# Patient Record
Sex: Female | Born: 1985 | Race: White | Hispanic: No | Smoking: Former smoker
Health system: Southern US, Community
[De-identification: ages and names within clinical notes are randomized; demographics above are authoritative.]

## PROBLEM LIST (undated history)

## (undated) DIAGNOSIS — F909 Attention-deficit hyperactivity disorder, unspecified type: Secondary | ICD-10-CM

## (undated) DIAGNOSIS — F329 Major depressive disorder, single episode, unspecified: Secondary | ICD-10-CM

## (undated) DIAGNOSIS — F32A Depression, unspecified: Secondary | ICD-10-CM

## (undated) DIAGNOSIS — F419 Anxiety disorder, unspecified: Secondary | ICD-10-CM

## (undated) DIAGNOSIS — B977 Papillomavirus as the cause of diseases classified elsewhere: Secondary | ICD-10-CM

## (undated) HISTORY — PX: NO PAST SURGERIES: SHX2092

## (undated) HISTORY — DX: Papillomavirus as the cause of diseases classified elsewhere: B97.7

---

## 2004-10-06 DIAGNOSIS — B977 Papillomavirus as the cause of diseases classified elsewhere: Secondary | ICD-10-CM

## 2004-10-06 HISTORY — DX: Papillomavirus as the cause of diseases classified elsewhere: B97.7

## 2014-03-07 ENCOUNTER — Encounter (HOSPITAL_COMMUNITY): Payer: Self-pay | Admitting: Emergency Medicine

## 2014-03-07 ENCOUNTER — Emergency Department (HOSPITAL_COMMUNITY)
Admission: EM | Admit: 2014-03-07 | Discharge: 2014-03-07 | Disposition: A | Discharge status: Home / Self Care | Payer: BC Managed Care – PPO | Attending: Emergency Medicine | Admitting: Emergency Medicine

## 2014-03-07 ENCOUNTER — Emergency Department (HOSPITAL_COMMUNITY): Payer: BC Managed Care – PPO

## 2014-03-07 DIAGNOSIS — Z3202 Encounter for pregnancy test, result negative: Secondary | ICD-10-CM | POA: Insufficient documentation

## 2014-03-07 DIAGNOSIS — Y92009 Unspecified place in unspecified non-institutional (private) residence as the place of occurrence of the external cause: Secondary | ICD-10-CM | POA: Insufficient documentation

## 2014-03-07 DIAGNOSIS — S62339A Displaced fracture of neck of unspecified metacarpal bone, initial encounter for closed fracture: Secondary | ICD-10-CM

## 2014-03-07 DIAGNOSIS — F17200 Nicotine dependence, unspecified, uncomplicated: Secondary | ICD-10-CM | POA: Insufficient documentation

## 2014-03-07 DIAGNOSIS — W2209XA Striking against other stationary object, initial encounter: Secondary | ICD-10-CM | POA: Insufficient documentation

## 2014-03-07 DIAGNOSIS — N390 Urinary tract infection, site not specified: Secondary | ICD-10-CM

## 2014-03-07 DIAGNOSIS — S62319A Displaced fracture of base of unspecified metacarpal bone, initial encounter for closed fracture: Secondary | ICD-10-CM | POA: Insufficient documentation

## 2014-03-07 DIAGNOSIS — Y9389 Activity, other specified: Secondary | ICD-10-CM | POA: Insufficient documentation

## 2014-03-07 LAB — POC URINE PREG, ED: Preg Test, Ur: NEGATIVE

## 2014-03-07 LAB — URINALYSIS, ROUTINE W REFLEX MICROSCOPIC
Bilirubin Urine: NEGATIVE
Glucose, UA: NEGATIVE mg/dL
Ketones, ur: NEGATIVE mg/dL
Nitrite: POSITIVE — AB
PH: 7.5 (ref 5.0–8.0)
Protein, ur: NEGATIVE mg/dL
Specific Gravity, Urine: 1.016 (ref 1.005–1.030)
UROBILINOGEN UA: 0.2 mg/dL (ref 0.0–1.0)

## 2014-03-07 LAB — URINE MICROSCOPIC-ADD ON

## 2014-03-07 MED ORDER — NITROFURANTOIN MONOHYD MACRO 100 MG PO CAPS: 100.0000 mg | ORAL_CAPSULE | Freq: Two times a day (BID) | ORAL | Status: DC

## 2014-03-07 MED ORDER — HYDROCODONE-ACETAMINOPHEN 5-325 MG PO TABS: ORAL_TABLET | ORAL | Status: DC

## 2014-03-07 MED ORDER — NAPROXEN 500 MG PO TABS: 500.0000 mg | ORAL_TABLET | Freq: Two times a day (BID) | ORAL | Status: DC

## 2014-03-07 NOTE — ED Notes (Signed)
Ortho at [facility]

## 2014-03-07 NOTE — ED Provider Notes (Signed)
CSN: [id number]     Arrival date & time 03/07/14  2057 History  This chart was scribed for non-physician practitioner, Renne Crigler, PA-C, working with Gilda Crease, MD by Shari Heritage, ED Scribe. This patient was seen in room TR08C/TR08C and the patient's care was started at 10:05 PM.   Chief Complaint  Patient presents with  . Hand Injury    The history is provided by the patient. No language interpreter was used.   HPI Comments: Gloria Heath is a 28 y.o. female who presents to the Emergency Department complaining of constant, moderate right hand pain resulting from an injury that occurred 3 hours ago. Patient states that she punched the a [place] cabinet at home and began having pain immediately after. Pain is worse with range of motion of her fingers. She has applied ice the her hand, but has not taken any medications for pain relief. She reports associated tingling to her right hand. There is no weakness in the right upper extremity.   Patient also reports that she was diagnosed with a UTI at [facility] about 3 weeks ago and was treated with an antibiotic (likely cipro, patient cannot remember exactly). She states that in the last few days, urinary symptoms have returned. She says that she develops recurrent UTIs and has a consult scheduled with [facility]. There is no fever, chills, nausea, or vomiting.   History reviewed. No pertinent past medical history. History reviewed. No pertinent past surgical history. History reviewed. No pertinent family history. History  Substance Use Topics  . Smoking status: Light Tobacco Smoker  . Smokeless tobacco: Not on file  . Alcohol Use: Yes   OB History   Grav Para Term Preterm Abortions TAB SAB Ect Mult Living                 Review of Systems  Constitutional: Negative for fever and chills.  Gastrointestinal: Negative for nausea and vomiting.  Genitourinary: Positive for frequency.  Musculoskeletal: Positive for  arthralgias (right hand pain). Negative for joint swelling.  Neurological: Negative for weakness.    Allergies  Review of patient's allergies indicates no known allergies.  Home Medications   Prior to Admission medications   Not on File   Physical Exam  Nursing note and vitals reviewed. Constitutional: She appears well-developed and well-nourished. No distress.  HENT:  Head: Normocephalic and atraumatic.  Eyes: Conjunctivae and EOM are normal.  Neck: Normal range of motion. No tracheal deviation present.  Cardiovascular: Normal rate.   Pulses:      Radial pulses are 2+ on the right side, and 2+ on the left side.  Pulmonary/Chest: Effort normal. No respiratory distress.  Abdominal: She exhibits no distension. There is no tenderness. There is no rebound.  Musculoskeletal:       Right shoulder: Normal.       Right elbow: Normal.      Right wrist: She exhibits normal range of motion, no tenderness, no bony tenderness and no swelling.       Right hand: She exhibits decreased range of motion, tenderness, bony tenderness, deformity and swelling. She exhibits normal capillary refill and no laceration. Normal sensation noted. Normal strength noted.       Hands: Neurological: She is alert.  Skin: Skin is warm and dry.  Psychiatric: She has a normal mood and affect. Her behavior is normal.    ED Course  Procedures (including critical care time) DIAGNOSTIC STUDIES: Oxygen Saturation is 100% on room air, normal by  my interpretation.    COORDINATION OF CARE: 10:12 PM- Patient informed of current plan for treatment and evaluation and agrees with plan at this time.  BP 129/84  Pulse 73  Temp(Src) 99.3 F (37.4 C) (Oral)  Resp 18  Wt 182 lb 6 oz (82.725 kg)  SpO2 100%  LMP 02/23/2014   Labs Reviewed  URINALYSIS, ROUTINE W REFLEX MICROSCOPIC - Abnormal; Notable for the following:    APPearance CLOUDY (*)    Hgb urine dipstick SMALL (*)    Nitrite POSITIVE (*)    Leukocytes, UA  MODERATE (*)    All other components within normal limits  URINE MICROSCOPIC-ADD ON - Abnormal; Notable for the following:    Squamous Epithelial / LPF FEW (*)    Bacteria, UA MANY (*)    All other components within normal limits  POC URINE PREG, ED     Imaging Review Dg Hand Complete Right  03/07/2014   CLINICAL DATA:  Injury.  Fifth metacarpal region pain.  EXAM: RIGHT HAND - COMPLETE 3+ VIEW  COMPARISON:  None.  FINDINGS: There is a transverse fracture across the distal fifth metacarpal. Is nondisplaced. It is angulated anteriorly by approximately 25 degrees. No significant comminution.  No other fractures. No dislocation. There is dorsal ulnar soft tissue swelling.  IMPRESSION: Nondisplaced, anteriorly angulated, fracture of the distal right fifth metacarpal.   Electronically Signed   By: Amie Portlandavid  Ormond M.D.   On: 03/07/2014 21:37     EKG Interpretation None      Patient seen and examined. Swelling of right hand with minimal deformity. No obvious rotation of the finger.  Ulnar gutter splint by orthopedic tech. Will discharge home with pain medicine and hand followup.  Patient also with urinary tract infection, will treat with Macrobid.   MDM   Final diagnoses:  Boxer's fracture  Urinary tract infection   Fifth metacarpal fracture: Treated with immobilization. No malrotation on exam. Fingers neurovascularly intact. Orthopedic hand followup given.  Urinary tract infection: No signs of pyelonephritis. Treat with Macrobid. Patient has frequent UTIs. She is following up with urology. Culture sent.  I personally performed the services described in this documentation, which was scribed in my presence. The recorded information has been reviewed and is accurate.   Renne CriglerJoshua Makinsley Schiavi, PA-C 03/07/14 2306

## 2014-03-07 NOTE — Progress Notes (Signed)
Orthopedic Tech Progress Note Patient Details:  Gloria Heath 1986/07/10 [id number]  Ortho Devices Type of Ortho Device: Ace wrap;Arm sling;Ulna gutter splint Ortho Device/Splint Location: rue Ortho Device/Splint Interventions: Application   Iriel Nason 03/07/2014, 10:48 PM

## 2014-03-07 NOTE — Discharge Instructions (Signed)
Please read and follow all provided instructions.  Your diagnoses today include:  1. Boxer's fracture   2. Urinary tract infection     Tests performed today include:  An x-ray of the affected area - shows broken 5th metacarpal  Urine test - shows urine infection  Vital signs. See below for your results today.   Medications prescribed:   Vicodin (hydrocodone/acetaminophen) - narcotic pain medication  DO NOT drive or perform any activities that require you to be awake and alert because this medicine can make you drowsy. BE VERY CAREFUL not to take multiple medicines containing Tylenol (also called acetaminophen). Doing so can lead to an overdose which can damage your liver and cause liver failure and possibly death.   Naproxen - anti-inflammatory pain medication  Do not exceed 500mg  naproxen every 12 hours, take with food  You have been prescribed an anti-inflammatory medication or NSAID. Take with food. Take smallest effective dose for the shortest duration needed for your pain. Stop taking if you experience stomach pain or vomiting.    Macrobid - antibiotic for urinary tract infection  You have been prescribed an antibiotic medicine: take the entire course of medicine even if you are feeling better. Stopping early can cause the antibiotic not to work.  Take any prescribed medications only as directed.  Home care instructions:   Follow any educational materials contained in this packet  Follow R.I.C.E. Protocol:  R - rest your injury   I  - use ice on injury without applying directly to skin  C - compress injury with bandage or splint  E - elevate the injury as much as possible  Follow-up instructions: Please follow-up with Dr. Amanda Pea for follow-up of your broken bone.   If you do not have a primary care doctor -- see below for referral information.   Return instructions:   Please return if your fingers are numb or tingling, appear gray or blue, or you have  severe pain (also elevate leg and loosen splint or wrap if you were given one)  Please return to the Emergency Department if you experience worsening symptoms.   Please return if you have any other emergent concerns.  Additional Information:  Your vital signs today were: BP 129/84   Pulse 73   Temp(Src) 99.3 F (37.4 C) (Oral)   Resp 18   Wt 182 lb 6 oz (82.725 kg)   SpO2 100%   LMP 02/23/2014 If your blood pressure (BP) was elevated above 135/85 this visit, please have this repeated by your doctor within one month. --------------

## 2014-03-07 NOTE — ED Notes (Signed)
Ortho paged. 

## 2014-03-07 NOTE — ED Notes (Signed)
Patient transported to X-ray 

## 2014-03-07 NOTE — ED Notes (Signed)
Presents with right hand swelling to 5th knuckle from punching a wall. Cms intact

## 2014-03-08 NOTE — ED Provider Notes (Signed)
Medical screening examination/treatment/procedure(s) were performed by non-physician practitioner and as supervising physician I was immediately available for consultation/collaboration.   EKG Interpretation None        Gilda Crease, MD 03/08/14 0110

## 2014-03-09 ENCOUNTER — Telehealth (HOSPITAL_COMMUNITY): Payer: Self-pay

## 2014-03-10 LAB — URINE CULTURE

## 2014-03-11 NOTE — Telephone Encounter (Signed)
Post ED Visit - Positive Culture Follow-up  Culture report reviewed by antimicrobial stewardship pharmacist: []  Wes Dulaney, Pharm.D., BCPS [x]  Celedonio Miyamoto, Pharm.D., BCPS []  Georgina Pillion, Pharm.D., BCPS []  [place], [address].D., BCPS, AAHIVP []  Estella Husk, Pharm.D., BCPS, AAHIVP []  Harvie Junior, Pharm.D.  Positive urine culture Treated with Macrobid, organism sensitive to the same and no further patient follow-up is required at this time.  Gloria Heath 03/11/2014, 10:45 AM

## 2014-12-26 ENCOUNTER — Encounter: Payer: Self-pay | Admitting: Family Medicine

## 2014-12-26 ENCOUNTER — Ambulatory Visit (INDEPENDENT_AMBULATORY_CARE_PROVIDER_SITE_OTHER): Payer: BLUE CROSS/BLUE SHIELD | Admitting: Family Medicine

## 2014-12-26 VITALS — BP 122/70 | HR 85 | Ht 65.5 in | Wt 179.0 lb

## 2014-12-26 DIAGNOSIS — Z308 Encounter for other contraceptive management: Secondary | ICD-10-CM

## 2014-12-26 DIAGNOSIS — Z3046 Encounter for surveillance of implantable subdermal contraceptive: Secondary | ICD-10-CM

## 2014-12-26 DIAGNOSIS — Z3009 Encounter for other general counseling and advice on contraception: Secondary | ICD-10-CM | POA: Diagnosis not present

## 2014-12-26 DIAGNOSIS — Z3161 Procreative counseling and advice using natural family planning: Secondary | ICD-10-CM

## 2014-12-26 NOTE — Patient Instructions (Signed)
Preparing for Pregnancy Before trying to become pregnant, make an appointment with your health care provider (preconception care). The goal is to help you have a healthy, safe pregnancy. At your first appointment, your health care provider will:   Do a complete physical exam, including a Pap test.  Take a complete medical history.  Give you advice and help you resolve any problems. PRECONCEPTION CHECKLIST Here is a list of the basics to cover with your health care provider at your preconception visit:  Medical history.  Tell your health care provider about any diseases you have had. Many diseases can affect your pregnancy.  Include your partner's medical history and family history.  Make sure you have been tested for sexually transmitted infections (STIs). These can affect your pregnancy. In some cases, they can be passed to your baby. Tell your health care provider about any history of STIs.  Make sure your health care provider knows about any previous problems you have had with conception or pregnancy.  Tell your health care provider about any medicine you take. This includes herbal supplements and over-the-counter medicines.  Make sure all your immunizations are up to date. You may need to make additional appointments.  Ask your health care provider if you need any vaccinations or if there are any you should avoid.  Diet.  It is especially important to eat a healthy, balanced diet with the right nutrients when you are pregnant.  Ask your health care provider to help you get to a healthy weight before pregnancy.  If you are overweight, you are at higher risk for certain complications. These include high blood pressure, diabetes, and preterm birth.  If you are underweight, you are more likely to have a low-birth-weight baby.  Lifestyle.  Tell your health care provider about lifestyle factors such as alcohol use, drug use, or smoking.  Describe any harmful substances you may  be exposed to at work or home. These can include chemicals, pesticides, and radiation.  Mental health.  Let your health care provider know if you have been feeling depressed or anxious.  Let your health care provider know if you have a history of substance abuse.  Let your health care provider know if you do not feel safe at home. HOME INSTRUCTIONS TO PREPARE FOR PREGNANCY Follow your health care provider's advice and instructions.   Keep an accurate record of your menstrual periods. This makes it easier for your health care provider to determine your baby's due date.  Begin taking prenatal vitamins and folic acid supplements daily. Take them as directed by your health care provider.  Eat a balanced diet. Get help from a nutrition counselor if you have questions or need help.  Get regular exercise. Try to be active for at least 30 minutes a day most days of the week.  Quit smoking, if you smoke.  Do not drink alcohol.  Do not take illegal drugs.  Get medical problems, such as diabetes or high blood pressure, under control.  If you have diabetes, make sure you do the following:  Have good blood sugar control. If you have type 1 diabetes, use multiple daily doses of insulin. Do not use split-dose or premixed insulin.  Have an eye exam by a qualified eye care professional trained in caring for people with diabetes.  Get evaluated by your health care provider for cardiovascular disease.  Get to a healthy weight. If you are overweight or obese, reduce your weight with the help of a qualified health   professional such as a Museum/gallery exhibitions officer. Ask your health care provider what the right weight range is for you. HOW DO I KNOW I AM PREGNANT? You may be pregnant if you have been sexually active and you miss your period. Symptoms of early pregnancy include:   Mild cramping.  Very light vaginal bleeding (spotting).  Feeling unusually tired.  Morning sickness. If you have any of  these symptoms, take a home pregnancy test. These tests look for a hormone called human chorionic gonadotropin (hCG) in your urine. Your body begins to make this hormone during early pregnancy. These tests are very accurate. Wait until at least the first day you miss your period to take one. If you get a positive result, call your health care provider to make appointments for prenatal care. WHAT SHOULD I DO IF I BECOME PREGNANT?  Make an appointment with your health care provider by week 12 of your pregnancy at the latest.  Do not smoke. Smoking can be harmful to your baby.  Do not drink alcoholic beverages. Alcohol is related to a number of birth defects.  Avoid toxic odors and chemicals.  You may continue to have sexual intercourse if it does not cause pain or other problems, such as vaginal bleeding. Document Released: 09/04/2008 Document Revised: 02/06/2014 Document Reviewed: 08/29/2013 [facility] Patient Information 2015 [place], [place]. This information is not intended to replace advice given to you by your health care provider. Make sure you discuss any questions you have with your health care provider. Contraception Choices Contraception (birth control) is the use of any methods or devices to prevent pregnancy. Below are some methods to help avoid pregnancy. HORMONAL METHODS   Contraceptive implant. This is a thin, plastic tube containing progesterone hormone. It does not contain estrogen hormone. Your health care provider inserts the tube in the inner part of the upper arm. The tube can remain in place for up to 3 years. After 3 years, the implant must be removed. The implant prevents the ovaries from releasing an egg (ovulation), thickens the cervical mucus to prevent sperm from entering the uterus, and thins the lining of the inside of the uterus.  Progesterone-only injections. These injections are given every 3 months by your health care provider to prevent pregnancy. This synthetic  progesterone hormone stops the ovaries from releasing eggs. It also thickens cervical mucus and changes the uterine lining. This makes it harder for sperm to survive in the uterus.  Birth control pills. These pills contain estrogen and progesterone hormone. They work by preventing the ovaries from releasing eggs (ovulation). They also cause the cervical mucus to thicken, preventing the sperm from entering the uterus. Birth control pills are prescribed by a health care provider.Birth control pills can also be used to treat heavy periods.  Minipill. This type of birth control pill contains only the progesterone hormone. They are taken every day of each month and must be prescribed by your health care provider.  Birth control patch. The patch contains hormones similar to those in birth control pills. It must be changed once a week and is prescribed by a health care provider.  Vaginal ring. The ring contains hormones similar to those in birth control pills. It is left in the vagina for 3 weeks, removed for 1 week, and then a new one is put back in place. The patient must be comfortable inserting and removing the ring from the vagina.A health care provider's prescription is necessary.  Emergency contraception. Emergency contraceptives prevent pregnancy after unprotected  sexual intercourse. This pill can be taken right after sex or up to 5 days after unprotected sex. It is most effective the sooner you take the pills after having sexual intercourse. Most emergency contraceptive pills are available without a prescription. Check with your pharmacist. Do not use emergency contraception as your only form of birth control. BARRIER METHODS   Female condom. This is a thin sheath (latex or rubber) that is worn over the penis during sexual intercourse. It can be used with spermicide to increase effectiveness.  Female condom. This is a soft, loose-fitting sheath that is put into the vagina before sexual  intercourse.  Diaphragm. This is a soft, latex, dome-shaped barrier that must be fitted by a health care provider. It is inserted into the vagina, along with a spermicidal jelly. It is inserted before intercourse. The diaphragm should be left in the vagina for 6 to 8 hours after intercourse.  Cervical cap. This is a round, soft, latex or plastic cup that fits over the cervix and must be fitted by a health care provider. The cap can be left in place for up to 48 hours after intercourse.  Sponge. This is a soft, circular piece of polyurethane foam. The sponge has spermicide in it. It is inserted into the vagina after wetting it and before sexual intercourse.  Spermicides. These are chemicals that kill or block sperm from entering the cervix and uterus. They come in the form of creams, jellies, suppositories, foam, or tablets. They do not require a prescription. They are inserted into the vagina with an applicator before having sexual intercourse. The process must be repeated every time you have sexual intercourse. INTRAUTERINE CONTRACEPTION  Intrauterine device (IUD). This is a T-shaped device that is put in a woman's uterus during a menstrual period to prevent pregnancy. There are 2 types:  Copper IUD. This type of IUD is wrapped in copper wire and is placed inside the uterus. Copper makes the uterus and fallopian tubes produce a fluid that kills sperm. It can stay in place for 10 years.  Hormone IUD. This type of IUD contains the hormone progestin (synthetic progesterone). The hormone thickens the cervical mucus and prevents sperm from entering the uterus, and it also thins the uterine lining to prevent implantation of a fertilized egg. The hormone can weaken or kill the sperm that get into the uterus. It can stay in place for 3-5 years, depending on which type of IUD is used. PERMANENT METHODS OF CONTRACEPTION  Female tubal ligation. This is when the woman's fallopian tubes are surgically sealed,  tied, or blocked to prevent the egg from traveling to the uterus.  Hysteroscopic sterilization. This involves placing a small coil or insert into each fallopian tube. Your doctor uses a technique called hysteroscopy to do the procedure. The device causes scar tissue to form. This results in permanent blockage of the fallopian tubes, so the sperm cannot fertilize the egg. It takes about 3 months after the procedure for the tubes to become blocked. You must use another form of birth control for these 3 months.  Female sterilization. This is when the female has the tubes that carry sperm tied off (vasectomy).This blocks sperm from entering the vagina during sexual intercourse. After the procedure, the man can still ejaculate fluid (semen). NATURAL PLANNING METHODS  Natural family planning. This is not having sexual intercourse or using a barrier method (condom, diaphragm, cervical cap) on days the woman could become pregnant.  Calendar method. This is keeping track  of the length of each menstrual cycle and identifying when you are fertile.  Ovulation method. This is avoiding sexual intercourse during ovulation.  Symptothermal method. This is avoiding sexual intercourse during ovulation, using a thermometer and ovulation symptoms.  Post-ovulation method. This is timing sexual intercourse after you have ovulated. Regardless of which type or method of contraception you choose, it is important that you use condoms to protect against the transmission of sexually transmitted infections (STIs). Talk with your health care provider about which form of contraception is most appropriate for you. Document Released: 09/22/2005 Document Revised: 09/27/2013 Document Reviewed: 03/17/2013 [facility] Patient Information 2015 [place], [place]. This information is not intended to replace advice given to you by your health care provider. Make sure you discuss any questions you have with your health care provider.

## 2014-12-26 NOTE — Progress Notes (Signed)
    Subjective:    Patient ID: Gloria Heath is a 29 y.o. female presenting with Gynecologic Exam  on 12/26/2014  HPI: New pt. Today. Would like Nexplanon removed-having prolonged periods...might be interested in having another child. Has lump on cervix, with nml pap in 6/15--?related to use of menstrual cup for menses.  Review of Systems  Constitutional: Negative for fever and chills.  Respiratory: Negative for shortness of breath.   Cardiovascular: Negative for chest pain.  Gastrointestinal: Negative for nausea, vomiting and abdominal pain.  Genitourinary: Negative for dysuria, vaginal discharge and vaginal pain.  Skin: Negative for rash.      Objective:     Filed Vitals:   12/26/14 0837  Height: 5' 5.5" (1.664 m)  Weight: 179 lb (81.194 kg)   Physical Exam  Constitutional: She is oriented to person, place, and time. She appears well-developed and well-nourished. No distress.  HENT:  Head: Normocephalic and atraumatic.  Eyes: No scleral icterus.  Neck: Neck supple.  Cardiovascular: Normal rate.   Pulmonary/Chest: Effort normal.  Abdominal: Soft.  Genitourinary: Vagina normal. Cervix exhibits no motion tenderness, no discharge and no friability.  No lesions--there is firmness on one side, but no mass  Neurological: She is alert and oriented to person, place, and time.  Skin: Skin is warm and dry.  Psychiatric: She has a normal mood and affect.   Procedure: Patient given informed consent for removal of her Implanon, time out was performed.  Signed copy in the chart.  Appropriate time out taken. Implanon site identified.  Area prepped in usual sterile fashon. One cc of 1% lidocaine was used to anesthetize the area at the distal end of the implant. A small stab incision was made right beside the implant on the distal portion.  The Nexplanon rod was grasped using hemostats and removed without difficulty.  There was less than 3 cc blood loss. There were no complications.  Steri-strips were applied over the small incision.  A pressure bandage was applied to reduce any bruising.  The patient tolerated the procedure well and was given post procedure instructions.     Assessment & Plan:   Problem List Items Addressed This Visit    None    Visit Diagnoses    Nexplanon removal    -  Primary    Contraceptive education        May use natural family planning until ready for next child--written and verbal information provided to the patient    Family planning advice        considering starting pregnancy by summer--advised begin PNV's      See AVS Return in about 3 months (around 03/28/2015).

## 2014-12-26 NOTE — Progress Notes (Signed)
Here today as a new patient for lump on her cervix.  Also interested in having her Nexplanon removed due to long periods and extended PMS symptoms.  Takes daily Macrobid given to her by her urologist as preventative to recurrent UTI's.  Started using the cup for period coverage, she was not sure if the was what caused the lump on cervix.  [facility]

## 2015-11-21 ENCOUNTER — Other Ambulatory Visit: Payer: Self-pay | Admitting: Family Medicine

## 2015-11-21 ENCOUNTER — Ambulatory Visit
Admission: RE | Admit: 2015-11-21 | Discharge: 2015-11-21 | Disposition: A | Discharge status: Home / Self Care | Payer: BLUE CROSS/BLUE SHIELD | Source: Ambulatory Visit | Attending: Family Medicine | Admitting: Family Medicine

## 2015-11-21 DIAGNOSIS — M542 Cervicalgia: Secondary | ICD-10-CM

## 2015-11-21 DIAGNOSIS — M25531 Pain in right wrist: Secondary | ICD-10-CM

## 2015-11-27 ENCOUNTER — Ambulatory Visit
Admission: RE | Admit: 2015-11-27 | Discharge: 2015-11-27 | Disposition: A | Discharge status: Home / Self Care | Payer: Managed Care, Other (non HMO) | Source: Ambulatory Visit | Attending: Family Medicine | Admitting: Family Medicine

## 2015-11-27 DIAGNOSIS — M542 Cervicalgia: Secondary | ICD-10-CM

## 2015-12-10 ENCOUNTER — Ambulatory Visit
Admission: RE | Admit: 2015-12-10 | Discharge: 2015-12-10 | Disposition: A | Discharge status: Home / Self Care | Payer: Managed Care, Other (non HMO) | Source: Ambulatory Visit | Attending: Family Medicine | Admitting: Family Medicine

## 2016-11-20 ENCOUNTER — Ambulatory Visit: Payer: Managed Care, Other (non HMO) | Admitting: Family Medicine

## 2016-11-22 ENCOUNTER — Other Ambulatory Visit: Payer: Self-pay | Admitting: Family Medicine

## 2017-02-21 ENCOUNTER — Encounter (HOSPITAL_COMMUNITY): Payer: Self-pay | Admitting: *Deleted

## 2017-02-21 DIAGNOSIS — Z87891 Personal history of nicotine dependence: Secondary | ICD-10-CM | POA: Insufficient documentation

## 2017-02-21 DIAGNOSIS — E860 Dehydration: Secondary | ICD-10-CM | POA: Insufficient documentation

## 2017-02-21 DIAGNOSIS — R197 Diarrhea, unspecified: Secondary | ICD-10-CM | POA: Diagnosis present

## 2017-02-21 LAB — POC URINE PREG, ED: Preg Test, Ur: NEGATIVE

## 2017-02-21 NOTE — ED Triage Notes (Addendum)
Pt complains of n/v/d since yesterday. Pt states she may have abdominal pain, but may just have sore abdominal muscles from doing kickboxing.   Pt states she had shrimp salad off a buffet yesterday.   Pt states she has vomited once an hour and has had ~10 episodes of diarrhea.

## 2017-02-22 ENCOUNTER — Emergency Department (HOSPITAL_COMMUNITY)
Admission: EM | Admit: 2017-02-22 | Discharge: 2017-02-22 | Disposition: A | Discharge status: Home / Self Care | Payer: Managed Care, Other (non HMO) | Attending: Emergency Medicine | Admitting: Emergency Medicine

## 2017-02-22 DIAGNOSIS — R1110 Vomiting, unspecified: Secondary | ICD-10-CM

## 2017-02-22 DIAGNOSIS — E860 Dehydration: Secondary | ICD-10-CM

## 2017-02-22 DIAGNOSIS — R197 Diarrhea, unspecified: Secondary | ICD-10-CM

## 2017-02-22 LAB — COMPREHENSIVE METABOLIC PANEL
ALBUMIN: 4 g/dL (ref 3.5–5.0)
ALT: 22 U/L (ref 14–54)
ANION GAP: 9 (ref 5–15)
AST: 21 U/L (ref 15–41)
Alkaline Phosphatase: 67 U/L (ref 38–126)
BUN: 11 mg/dL (ref 6–20)
CO2: 23 mmol/L (ref 22–32)
Calcium: 8.9 mg/dL (ref 8.9–10.3)
Chloride: 106 mmol/L (ref 101–111)
Creatinine, Ser: 0.69 mg/dL (ref 0.44–1.00)
GFR calc Af Amer: >60 mL/min (ref >60)
GFR calc non Af Amer: >60 mL/min (ref >60)
Glucose, Bld: 106 mg/dL — ABNORMAL HIGH (ref 65–99)
POTASSIUM: 3.9 mmol/L (ref 3.5–5.1)
Sodium: 138 mmol/L (ref 135–145)
Total Bilirubin: 0.9 mg/dL (ref 0.3–1.2)
Total Protein: 7.4 g/dL (ref 6.5–8.1)

## 2017-02-22 LAB — CBC
HEMATOCRIT: 38.9 % (ref 36.0–46.0)
HEMOGLOBIN: 13 g/dL (ref 12.0–15.0)
MCH: 32.6 pg (ref 26.0–34.0)
MCHC: 33.4 g/dL (ref 30.0–36.0)
MCV: 97.5 fL (ref 78.0–100.0)
Platelets: 249 10*3/uL (ref 150–400)
RBC: 3.99 MIL/uL (ref 3.87–5.11)
RDW: 13.1 % (ref 11.5–15.5)
WBC: 10.3 10*3/uL (ref 4.0–10.5)

## 2017-02-22 LAB — URINALYSIS, ROUTINE W REFLEX MICROSCOPIC
BILIRUBIN URINE: NEGATIVE
Glucose, UA: NEGATIVE mg/dL
HGB URINE DIPSTICK: NEGATIVE
Ketones, ur: NEGATIVE mg/dL
Nitrite: NEGATIVE
Protein, ur: NEGATIVE mg/dL
SPECIFIC GRAVITY, URINE: 1.02 (ref 1.005–1.030)
pH: 5 (ref 5.0–8.0)

## 2017-02-22 LAB — I-STAT CHEM 8, ED
BUN: 10 mg/dL (ref 6–20)
Calcium, Ion: 1.14 mmol/L — ABNORMAL LOW (ref 1.15–1.40)
Chloride: 105 mmol/L (ref 101–111)
Creatinine, Ser: 0.5 mg/dL (ref 0.44–1.00)
Glucose, Bld: 105 mg/dL — ABNORMAL HIGH (ref 65–99)
HCT: 37 % (ref 36.0–46.0)
Hemoglobin: 12.6 g/dL (ref 12.0–15.0)
Potassium: 3.6 mmol/L (ref 3.5–5.1)
Sodium: 140 mmol/L (ref 135–145)
TCO2: 24 mmol/L (ref 0–100)

## 2017-02-22 LAB — URINALYSIS, MICROSCOPIC (REFLEX)

## 2017-02-22 LAB — LIPASE, BLOOD: Lipase: 20 U/L (ref 11–51)

## 2017-02-22 MED ORDER — LOPERAMIDE HCL 2 MG PO CAPS
4.0000 mg | ORAL_CAPSULE | Freq: Once | ORAL | Status: AC
  Administered 2017-02-22: 4 mg via ORAL
  Filled 2017-02-22: qty 2

## 2017-02-22 MED ORDER — PHENAZOPYRIDINE HCL 100 MG PO TABS
100.0000 mg | ORAL_TABLET | Freq: Once | ORAL | Status: AC
  Administered 2017-02-22: 100 mg via ORAL
  Filled 2017-02-22: qty 1

## 2017-02-22 MED ORDER — ONDANSETRON 8 MG PO TBDP: ORAL_TABLET | ORAL | 0 refills | Status: DC

## 2017-02-22 MED ORDER — SODIUM CHLORIDE 0.9 % IV BOLUS (SEPSIS)
1000.0000 mL | Freq: Once | INTRAVENOUS | Status: AC
  Administered 2017-02-22: 1000 mL via INTRAVENOUS

## 2017-02-22 MED ORDER — ONDANSETRON HCL 4 MG/2ML IJ SOLN
4.0000 mg | Freq: Once | INTRAMUSCULAR | Status: AC
  Administered 2017-02-22: 4 mg via INTRAVENOUS
  Filled 2017-02-22: qty 2

## 2017-02-22 NOTE — ED Provider Notes (Signed)
[facility]ENC[facility]PT Provider Note   CSN: [id number] Arrival date & time: 02/21/17  2244   By signing my name below, I, Gloria Heath, attest that this documentation has been prepared under the direction and in the presence of [name], [name], MD. Electronically signed, Gloria Heath, ED Scribe. 02/22/17. 12:43 AM.   History   Chief Complaint Chief Complaint  Patient presents with  . Emesis  . Diarrhea   The history is provided by the patient and medical records. No language interpreter was used.  Diarrhea   This is a new problem. The current episode started yesterday. The problem occurs more than 10 times per day. The problem has been gradually worsening. There has been no fever. Associated symptoms include abdominal pain and vomiting. Pertinent negatives include no headaches and no myalgias.  Emesis   This is a new problem. The current episode started 12 to 24 hours ago. Associated symptoms include abdominal pain and diarrhea. Pertinent negatives include no fever, no headaches and no myalgias.    Gloria Heath is a 31 y.o. female with h/o anxiety, an "eating disorder" and frequent UTI's who presents to the Emergency Department with concern for new onset diarrhea onset 02/20/2017 (1-2 times x "every few hours"). Associated nausea and vomiting ( 1-2 x hourly, 3-4 x in triage) onset yesterday morning. Abdominal pain relieved with vomiting and "urinary tract pain" since arrival to ED alleged. Pt reportedly ate shrimp salad at a buffet on the date of onset, followed by chicken and pizza, respectively throughout the day. Pt on abx frequently for recurrent UTI's; last course of abx reportedly 1 month ago. No fever noted. No sick contacts noted recently. No other complaints at this time.   Past Medical History:  Diagnosis Date  . HPV (human papilloma virus) infection 2006   cleared on it's own, no treatment.     There are no active problems to display for this patient.   History reviewed.  No pertinent surgical history.  OB History    Gravida Para Term Preterm AB Living   2 1 1    0 1   SAB TAB Ectopic Multiple Live Births     0             Home Medications    Prior to Admission medications   Not on File    Family History Family History  Problem Relation Age of Onset  . Polycystic ovary syndrome Sister   . Polycystic ovary syndrome Maternal Grandmother   . Cancer Maternal Grandmother        lung  . Cancer Paternal Grandfather        lung    Social History Social History  Substance Use Topics  . Smoking status: Former Smoker    Quit date: 09/26/2014  . Smokeless tobacco: Never Used  . Alcohol use 2.4 oz/week    2 Glasses of wine, 2 Cans of beer per week     Comment: social     Allergies   Patient has no known allergies.   Review of Systems Review of Systems  Constitutional: Negative for fever.  Gastrointestinal: Positive for abdominal pain, diarrhea, nausea and vomiting.  Musculoskeletal: Negative for myalgias.  Neurological: Negative for headaches.  All other systems reviewed and are negative.    Physical Exam Updated Vital Signs BP 125/79 (BP Location: Left Arm)   Pulse (!) 106   Temp 99 F (37.2 C) (Oral)   Resp 20   LMP 01/22/2017   SpO2 99%  Physical Exam CONSTITUTIONAL: Well developed/well nourished HEAD: Normocephalic/atraumatic EYES: EOMI/PERRL, no icterus ENMT: Mucous membranes dry NECK: supple no meningeal signs SPINE/BACK:entire spine nontender CV: S1/S2 noted, no murmurs/rubs/gallops noted LUNGS: Lungs are clear to auscultation bilaterally, no apparent distress ABDOMEN: soft, nontender, no rebound or guarding, bowel sounds noted throughout abdomen GU:no cva tenderness NEURO: Pt is awake/alert/appropriate, moves all extremitiesx4.  No facial droop.   EXTREMITIES: pulses normal/equal, full ROM SKIN: warm, color normal PSYCH: no abnormalities of mood noted, alert and oriented to situation   ED Treatments / Results    DIAGNOSTIC STUDIES: Oxygen Saturation is 99% on RA, NL by my interpretation.    COORDINATION OF CARE: 12:37 AM-Discussed next steps with pt. Pt verbalized understanding and is agreeable with the plan. Will order IV medications and review labs.   Labs (all labs ordered are listed, but only abnormal results are displayed) Labs Reviewed  COMPREHENSIVE METABOLIC PANEL - Abnormal; Notable for the following:       Result Value   Glucose, Bld 106 (*)    All other components within normal limits  URINALYSIS, ROUTINE W REFLEX MICROSCOPIC - Abnormal; Notable for the following:    APPearance HAZY (*)    Leukocytes, UA MODERATE (*)    All other components within normal limits  URINALYSIS, MICROSCOPIC (REFLEX) - Abnormal; Notable for the following:    Bacteria, UA RARE (*)    Squamous Epithelial / LPF 6-30 (*)    All other components within normal limits  I-STAT CHEM 8, ED - Abnormal; Notable for the following:    Glucose, Bld 105 (*)    Calcium, Ion 1.14 (*)    All other components within normal limits  LIPASE, BLOOD  CBC  POC URINE PREG, ED    EKG  EKG Interpretation None       Radiology No results found.  Procedures Procedures (including critical care time)  Medications Ordered in ED Medications  sodium chloride 0.9 % bolus 1,000 mL (1,000 mLs Intravenous New Bag/Given 02/22/17 0118)  ondansetron (ZOFRAN) injection 4 mg (4 mg Intravenous Given 02/22/17 0118)  loperamide (IMODIUM) capsule 4 mg (4 mg Oral Given 02/22/17 0117)  phenazopyridine (PYRIDIUM) tablet 100 mg (100 mg Oral Given 02/22/17 0117)     Initial Impression / Assessment and Plan / ED Course  I have reviewed the triage vital signs and the nursing notes.  Pertinent labs  results that were available during my care of the patient were reviewed by me and considered in my medical decision making (see chart for details).     Pt improved She had requested pyridium for dysuria, but no UTI noted She is taking  PO Will d/c home  Discussed strict ER precautions with patient All pertinent questions answered prior to discharge Patient appropriate and safe for discharge at this time    Final Clinical Impressions(s) / ED Diagnoses   Final diagnoses:  Vomiting and diarrhea  Dehydration    New Prescriptions New Prescriptions   ONDANSETRON (ZOFRAN ODT) 8 MG DISINTEGRATING TABLET    8mg  ODT q4 hours prn nausea   I personally performed the services described in this documentation, which was scribed in my presence. The recorded information has been reviewed and is accurate.      Zadie Rhine, MD 02/22/17 [phone]

## 2017-09-22 ENCOUNTER — Ambulatory Visit
Admission: RE | Admit: 2017-09-22 | Discharge: 2017-09-22 | Disposition: A | Discharge status: Home / Self Care | Payer: Worker's Compensation | Source: Ambulatory Visit | Attending: Family Medicine | Admitting: Family Medicine

## 2017-09-22 ENCOUNTER — Other Ambulatory Visit: Payer: Self-pay | Admitting: Family Medicine

## 2017-09-22 DIAGNOSIS — M545 Low back pain, unspecified: Secondary | ICD-10-CM

## 2017-09-22 DIAGNOSIS — M546 Pain in thoracic spine: Secondary | ICD-10-CM

## 2017-09-22 DIAGNOSIS — M542 Cervicalgia: Secondary | ICD-10-CM

## 2017-09-22 DIAGNOSIS — S3992XA Unspecified injury of lower back, initial encounter: Secondary | ICD-10-CM

## 2017-09-27 ENCOUNTER — Other Ambulatory Visit: Payer: Self-pay

## 2017-09-27 ENCOUNTER — Ambulatory Visit (HOSPITAL_COMMUNITY)
Admission: EM | Admit: 2017-09-27 | Discharge: 2017-09-27 | Disposition: A | Discharge status: Home / Self Care | Payer: Worker's Compensation | Attending: Family Medicine | Admitting: Family Medicine

## 2017-09-27 DIAGNOSIS — S22080A Wedge compression fracture of T11-T12 vertebra, initial encounter for closed fracture: Secondary | ICD-10-CM | POA: Diagnosis not present

## 2017-09-27 MED ORDER — HYDROCODONE-ACETAMINOPHEN 5-325 MG PO TABS
1.0000 | ORAL_TABLET | Freq: Once | ORAL | Status: AC
  Administered 2017-09-27: 1 via ORAL

## 2017-09-27 MED ORDER — HYDROCODONE-ACETAMINOPHEN 5-325 MG PO TABS
ORAL_TABLET | ORAL | Status: AC
  Filled 2017-09-27: qty 1

## 2017-09-27 MED ORDER — HYDROCODONE-ACETAMINOPHEN 5-325 MG PO TABS: 1.0000 | ORAL_TABLET | Freq: Four times a day (QID) | ORAL | 0 refills | Status: AC | PRN

## 2017-09-27 NOTE — Discharge Instructions (Signed)
We gave you 1 hydrocodone-acetaminophen today.   Prescription for 1 days worth provided.   Please Follow up with Primary care for continued refills of this.

## 2017-09-27 NOTE — ED Triage Notes (Addendum)
Per pt he was in a accident at work, per pt her doctor gived her Norcos and she will not be able to get it until tomorrow night. Per pt her neck and back is in pain, per pt she would like to have enough meds to last her from now until tomorrow night, Per pt her provider recommended that she see an ortho doctor Jan 10th. Per pt she had xray and she has a Fracture Spine.

## 2017-09-27 NOTE — ED Provider Notes (Signed)
[facility]    CSN: [id number] Arrival date & time: 09/27/17  1918     History   Chief Complaint Chief Complaint  Patient presents with  . Neck Pain  . Back Pain    HPI Gloria Heath is a 31 y.o. female presenting with acute back pain. She injured herswefl on 12/13 while moving a leather chair, it fell on her head. Xray on 12/18 showing T11 anterior wedging. She has been getting Norco from her PCP, refilled on Thursday, ran out yesterday afternoon. Unable to handle pain until tomorrow, requesting pain medicine. Following up with [facility], first available appointment 10/15/2017.   HPI  Past Medical History:  Diagnosis Date  . HPV (human papilloma virus) infection 2006   cleared on it's own, no treatment.     There are no active problems to display for this patient.   No past surgical history on file.  OB History    Gravida Para Term Preterm AB Living   2 1 1    0 1   SAB TAB Ectopic Multiple Live Births     0             Home Medications    Prior to Admission medications   Medication Sig Start Date End Date Taking? Authorizing Provider  AMOXICILLIN PO Take by mouth.   Yes [provider]  amphetamine-dextroamphetamine (ADDERALL) 20 MG tablet Take 20 mg by mouth daily.   Yes [provider]  ALPRAZolam Prudy Feeler(XANAX) 0.5 MG tablet Take 0.5 mg by mouth 2 (two) times daily as needed for anxiety.  02/13/17   [provider]  HYDROcodone-acetaminophen (NORCO) 5-325 MG tablet Take 1 tablet by mouth every 6 (six) hours as needed for up to 1 day. 09/27/17 09/28/17  Wieters, Hallie C, PA-C  ondansetron (ZOFRAN ODT) 8 MG disintegrating tablet 8mg  ODT q4 hours prn nausea 02/22/17   [name], Donald, MD    Family History Family History  Problem Relation Age of Onset  . Polycystic ovary syndrome Sister   . Polycystic ovary syndrome Maternal Grandmother   . Cancer Maternal Grandmother        lung  . Cancer Paternal Grandfather        lung     Social History Social History   Tobacco Use  . Smoking status: Former Smoker    Last attempt to quit: 09/26/2014    Years since quitting: 3.0  . Smokeless tobacco: Never Used  Substance Use Topics  . Alcohol use: Yes    Alcohol/week: 2.4 oz    Types: 2 Glasses of wine, 2 Cans of beer per week    Comment: social  . Drug use: No     Allergies   Patient has no known allergies.   Review of Systems Review of Systems  Constitutional: Negative for fever.  Respiratory: Negative for shortness of breath.   Gastrointestinal: Negative for nausea and vomiting.  Musculoskeletal: Positive for back pain.     Physical Exam Triage Vital Signs ED Triage Vitals  Enc Vitals Group     BP 09/27/17 1947 (!) 143/80     Pulse Rate 09/27/17 1947 (!) 128     Resp --      Temp 09/27/17 1947 98.4 F (36.9 C)     Temp Source 09/27/17 1947 Oral     SpO2 09/27/17 1947 100 %     Weight --      Height --      Head Circumference --  Peak Flow --      Pain Score 09/27/17 1944 8     Pain Loc --      Pain Edu? --      Excl. in GC? --    No data found.  Updated Vital Signs BP (!) 143/80 (BP Location: Right Arm)   Pulse (!) 128   Temp 98.4 F (36.9 C) (Oral)   LMP 09/10/2017 (Approximate)   SpO2 100%    Physical Exam  Constitutional: She is oriented to person, place, and time. She appears well-developed and well-nourished. No distress.  Lying over exam table, as sitting is uncomfortable for her  HENT:  Head: Normocephalic and atraumatic.  Eyes: Conjunctivae are normal.  Neck: Neck supple.  Cardiovascular: Normal rate and regular rhythm.  No murmur heard. Pulmonary/Chest: Effort normal. No respiratory distress.  Abdominal: Soft. There is no tenderness.  Musculoskeletal: She exhibits no edema.  Neurological: She is alert and oriented to person, place, and time.  Skin: Skin is warm and dry.  Psychiatric: She has a normal mood and affect.  Nursing note and vitals  reviewed.    UC Treatments / Results  Labs (all labs ordered are listed, but only abnormal results are displayed) Labs Reviewed - No data to display  EKG  EKG Interpretation None       Radiology No results found.  Procedures Procedures (including critical care time)  Medications Ordered in UC Medications  HYDROcodone-acetaminophen (NORCO/VICODIN) 5-325 MG per tablet 1 tablet (1 tablet Oral Given 09/27/17 2023)     Initial Impression / Assessment and Plan / UC Course  I have reviewed the triage vital signs and the nursing notes.  Pertinent labs & imaging results that were available during my care of the patient were reviewed by me and considered in my medical decision making (see chart for details).     Patient driven by husband. Given Norco in clinic. Refill for 1 day provided. Advised to call PCP in AM for further medication, advise on other Orthopedics in the area.   Final Clinical Impressions(s) / UC Diagnoses   Final diagnoses:  Closed wedge compression fracture of eleventh thoracic vertebra, initial encounter [facility](HCC)    ED Discharge Orders        Ordered    HYDROcodone-acetaminophen (NORCO) 5-325 MG tablet  Every 6 hours PRN     09/27/17 2021       Controlled Substance Prescriptions [place] Controlled Substance Registry consulted? Yes, I have consulted the [place] Controlled Substances Registry for this patient, and feel the risk/benefit ratio today is favorable for proceeding with this prescription for a controlled substance. 2 recent prescriptions lining up with her history.   Lew DawesWieters, Hallie C, [place]PA-C 09/27/17 2033

## 2017-10-19 ENCOUNTER — Other Ambulatory Visit: Payer: Self-pay | Admitting: Specialist

## 2017-10-19 DIAGNOSIS — M5412 Radiculopathy, cervical region: Secondary | ICD-10-CM

## 2017-10-20 ENCOUNTER — Other Ambulatory Visit: Payer: Self-pay | Admitting: Specialist

## 2017-10-20 DIAGNOSIS — M4854XA Collapsed vertebra, not elsewhere classified, thoracic region, initial encounter for fracture: Secondary | ICD-10-CM

## 2018-06-29 ENCOUNTER — Inpatient Hospital Stay (HOSPITAL_COMMUNITY)
Admission: RE | Admit: 2018-06-29 | Discharge: 2018-07-02 | DRG: 885 | Disposition: A | Discharge status: Home / Self Care | Payer: 59 | Attending: Psychiatry | Admitting: Psychiatry

## 2018-06-29 DIAGNOSIS — Z79899 Other long term (current) drug therapy: Secondary | ICD-10-CM

## 2018-06-29 DIAGNOSIS — Z560 Unemployment, unspecified: Secondary | ICD-10-CM | POA: Diagnosis not present

## 2018-06-29 DIAGNOSIS — F17210 Nicotine dependence, cigarettes, uncomplicated: Secondary | ICD-10-CM | POA: Diagnosis present

## 2018-06-29 DIAGNOSIS — Z91410 Personal history of adult physical and sexual abuse: Secondary | ICD-10-CM | POA: Diagnosis not present

## 2018-06-29 DIAGNOSIS — R45851 Suicidal ideations: Secondary | ICD-10-CM | POA: Diagnosis present

## 2018-06-29 DIAGNOSIS — F332 Major depressive disorder, recurrent severe without psychotic features: Secondary | ICD-10-CM | POA: Diagnosis present

## 2018-06-29 DIAGNOSIS — R000 Tachycardia, unspecified: Secondary | ICD-10-CM | POA: Diagnosis present

## 2018-06-29 DIAGNOSIS — Z801 Family history of malignant neoplasm of trachea, bronchus and lung: Secondary | ICD-10-CM

## 2018-06-29 DIAGNOSIS — R030 Elevated blood-pressure reading, without diagnosis of hypertension: Secondary | ICD-10-CM | POA: Diagnosis present

## 2018-06-29 DIAGNOSIS — X58XXXA Exposure to other specified factors, initial encounter: Secondary | ICD-10-CM | POA: Diagnosis present

## 2018-06-29 DIAGNOSIS — T23001A Burn of unspecified degree of right hand, unspecified site, initial encounter: Secondary | ICD-10-CM | POA: Diagnosis present

## 2018-06-29 DIAGNOSIS — Z62810 Personal history of physical and sexual abuse in childhood: Secondary | ICD-10-CM | POA: Diagnosis present

## 2018-06-29 DIAGNOSIS — Z915 Personal history of self-harm: Secondary | ICD-10-CM

## 2018-06-29 DIAGNOSIS — G4700 Insomnia, unspecified: Secondary | ICD-10-CM | POA: Diagnosis present

## 2018-06-29 DIAGNOSIS — T23002A Burn of unspecified degree of left hand, unspecified site, initial encounter: Secondary | ICD-10-CM | POA: Diagnosis present

## 2018-06-29 DIAGNOSIS — F4310 Post-traumatic stress disorder, unspecified: Secondary | ICD-10-CM | POA: Diagnosis present

## 2018-06-29 DIAGNOSIS — F603 Borderline personality disorder: Secondary | ICD-10-CM

## 2018-06-29 HISTORY — DX: Major depressive disorder, single episode, unspecified: F32.9

## 2018-06-29 HISTORY — DX: Depression, unspecified: F32.A

## 2018-06-29 HISTORY — DX: Anxiety disorder, unspecified: F41.9

## 2018-06-29 MED ORDER — HYDROXYZINE HCL 25 MG PO TABS
25.0000 mg | ORAL_TABLET | Freq: Three times a day (TID) | ORAL | Status: DC | PRN
  Administered 2018-06-30 – 2018-07-01 (×3): 25 mg via ORAL
  Filled 2018-06-29 (×3): qty 1

## 2018-06-29 MED ORDER — ACETAMINOPHEN 325 MG PO TABS: 650.0000 mg | ORAL_TABLET | Freq: Four times a day (QID) | ORAL | Status: DC | PRN

## 2018-06-29 MED ORDER — SILVER SULFADIAZINE 1 % EX CREA
TOPICAL_CREAM | Freq: Two times a day (BID) | CUTANEOUS | Status: DC
  Filled 2018-06-29: qty 85

## 2018-06-29 MED ORDER — TRAZODONE HCL 50 MG PO TABS
50.0000 mg | ORAL_TABLET | Freq: Every evening | ORAL | Status: DC | PRN
  Administered 2018-06-30 – 2018-07-01 (×3): 50 mg via ORAL
  Filled 2018-06-29 (×3): qty 1

## 2018-06-29 MED ORDER — SILVER SULFADIAZINE 1 % EX CREA
TOPICAL_CREAM | Freq: Two times a day (BID) | CUTANEOUS | Status: DC
  Administered 2018-06-30 – 2018-07-02 (×6): via TOPICAL
  Filled 2018-06-29: qty 20
  Filled 2018-06-29: qty 85

## 2018-06-29 MED ORDER — GABAPENTIN 600 MG PO TABS
600.0000 mg | ORAL_TABLET | Freq: Three times a day (TID) | ORAL | Status: DC
  Administered 2018-06-30 – 2018-07-02 (×8): 600 mg via ORAL
  Filled 2018-06-29 (×14): qty 1

## 2018-06-29 MED ORDER — MAGNESIUM HYDROXIDE 400 MG/5ML PO SUSP: 30.0000 mL | Freq: Every day | ORAL | Status: DC | PRN

## 2018-06-29 MED ORDER — IBUPROFEN 800 MG PO TABS
800.0000 mg | ORAL_TABLET | Freq: Four times a day (QID) | ORAL | Status: DC | PRN
  Administered 2018-06-30: 800 mg via ORAL
  Filled 2018-06-29: qty 1

## 2018-06-29 MED ORDER — ALUM & MAG HYDROXIDE-SIMETH 200-200-20 MG/5ML PO SUSP: 30.0000 mL | ORAL | Status: DC | PRN

## 2018-06-29 NOTE — H&P (Signed)
Behavioral Health Medical Screening Exam  Gloria Heath is an 32 y.o. female.  Total Time spent with patient: 30 minutes  Psychiatric Specialty Exam: Physical Exam  Constitutional: She is oriented to person, place, and time. She appears well-developed and well-nourished. No distress.  HENT:  Head: Normocephalic and atraumatic.  Right Ear: External ear normal.  Left Ear: External ear normal.  Eyes: Conjunctivae are normal. Right eye exhibits no discharge. Left eye exhibits no discharge. No scleral icterus.  Respiratory: Effort normal. No respiratory distress.  Musculoskeletal: Normal range of motion.  Neurological: She is alert and oriented to person, place, and time.  Skin: Skin is warm and dry. Burn noted. She is not diaphoretic.     Psychiatric: Her speech is normal. Her mood appears anxious. She is not withdrawn and not actively hallucinating. Thought content is not paranoid and not delusional. Cognition and memory are normal. She expresses impulsivity and inappropriate judgment. She exhibits a depressed mood. She expresses suicidal ideation. She expresses no homicidal ideation. She expresses no suicidal plans.    ROS  There were no vitals taken for this visit.There is no height or weight on file to calculate BMI.  General Appearance: Casual and Fairly Groomed  Eye Contact:  Good  Speech:  Clear and Coherent and Normal Rate  Volume:  Normal  Mood:  Anxious, Depressed, Dysphoric, Hopeless and Worthless  Affect:  Congruent, Depressed and Tearful  Thought Process:  Coherent, Goal Directed and Descriptions of Associations: Intact  Orientation:  Full (Time, Place, and Person)  Thought Content:  Logical and Hallucinations: None  Suicidal Thoughts:  Yes.  without intent/plan  Homicidal Thoughts:  No  Memory:  Immediate;   Good Recent;   Fair  Judgement:  Impaired  Insight:  Fair  Psychomotor Activity:  Normal  Concentration: Concentration: Fair and Attention Span: Fair  Recall:   Good  Fund of Knowledge:Good  Language: Good  Akathisia:  NA  Handed:  Right  AIMS (if indicated):     Assets:  Communication Skills Desire for Improvement Intimacy Leisure Time Physical Health Transportation  Sleep:       Musculoskeletal: Strength & Muscle Tone: within normal limits Gait & Station: normal  Recommendations:  Based on my evaluation the patient does not appear to have an emergency medical condition.  Jackelyn PolingJason A Caliah Kopke, NP 06/29/2018, 11:08 PM
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
## 2018-06-30 ENCOUNTER — Encounter (HOSPITAL_COMMUNITY): Payer: Self-pay | Admitting: *Deleted

## 2018-06-30 ENCOUNTER — Other Ambulatory Visit: Payer: Self-pay

## 2018-06-30 DIAGNOSIS — F4310 Post-traumatic stress disorder, unspecified: Secondary | ICD-10-CM

## 2018-06-30 DIAGNOSIS — R45851 Suicidal ideations: Secondary | ICD-10-CM

## 2018-06-30 DIAGNOSIS — F603 Borderline personality disorder: Secondary | ICD-10-CM

## 2018-06-30 DIAGNOSIS — F17210 Nicotine dependence, cigarettes, uncomplicated: Secondary | ICD-10-CM

## 2018-06-30 LAB — CBC
HEMATOCRIT: 40.7 % (ref 36.0–46.0)
HEMOGLOBIN: 13.4 g/dL (ref 12.0–15.0)
MCH: 33.4 pg (ref 26.0–34.0)
MCHC: 32.9 g/dL (ref 30.0–36.0)
MCV: 101.5 fL — ABNORMAL HIGH (ref 78.0–100.0)
Platelets: 234 10*3/uL (ref 150–400)
RBC: 4.01 MIL/uL (ref 3.87–5.11)
RDW: 13 % (ref 11.5–15.5)
WBC: 9.9 10*3/uL (ref 4.0–10.5)

## 2018-06-30 LAB — LIPID PANEL
CHOLESTEROL: 136 mg/dL (ref 0–200)
HDL: 35 mg/dL — ABNORMAL LOW (ref >40)
LDL Cholesterol: 87 mg/dL (ref 0–99)
TRIGLYCERIDES: 70 mg/dL (ref <150)
Total CHOL/HDL Ratio: 3.9 RATIO
VLDL: 14 mg/dL (ref 0–40)

## 2018-06-30 LAB — COMPREHENSIVE METABOLIC PANEL
ALK PHOS: 71 U/L (ref 38–126)
ALT: 15 U/L (ref 0–44)
ANION GAP: 8 (ref 5–15)
AST: 14 U/L — ABNORMAL LOW (ref 15–41)
Albumin: 3.4 g/dL — ABNORMAL LOW (ref 3.5–5.0)
BILIRUBIN TOTAL: 0.5 mg/dL (ref 0.3–1.2)
BUN: 13 mg/dL (ref 6–20)
CALCIUM: 9.2 mg/dL (ref 8.9–10.3)
CO2: 29 mmol/L (ref 22–32)
Chloride: 105 mmol/L (ref 98–111)
Creatinine, Ser: 0.71 mg/dL (ref 0.44–1.00)
GFR calc non Af Amer: >60 mL/min (ref >60)
Glucose, Bld: 108 mg/dL — ABNORMAL HIGH (ref 70–99)
POTASSIUM: 3.9 mmol/L (ref 3.5–5.1)
Sodium: 142 mmol/L (ref 135–145)
TOTAL PROTEIN: 6.8 g/dL (ref 6.5–8.1)

## 2018-06-30 LAB — RAPID URINE DRUG SCREEN, HOSP PERFORMED
AMPHETAMINES: POSITIVE — AB
BENZODIAZEPINES: NOT DETECTED
Barbiturates: NOT DETECTED
COCAINE: NOT DETECTED
OPIATES: NOT DETECTED
TETRAHYDROCANNABINOL: POSITIVE — AB

## 2018-06-30 LAB — HEMOGLOBIN A1C
Hgb A1c MFr Bld: 5.9 % — ABNORMAL HIGH (ref 4.8–5.6)
MEAN PLASMA GLUCOSE: 122.63 mg/dL

## 2018-06-30 LAB — PREGNANCY, URINE: PREG TEST UR: NEGATIVE

## 2018-06-30 LAB — TSH: TSH: 1.66 u[IU]/mL (ref 0.350–4.500)

## 2018-06-30 MED ORDER — CHLORPROMAZINE HCL 10 MG PO TABS
10.0000 mg | ORAL_TABLET | Freq: Three times a day (TID) | ORAL | Status: DC
Start: 2018-06-30 — End: 2018-07-01
  Administered 2018-06-30 (×3): 10 mg via ORAL
  Filled 2018-06-30 (×11): qty 1

## 2018-06-30 MED ORDER — NICOTINE POLACRILEX 2 MG MT GUM
2.0000 mg | CHEWING_GUM | OROMUCOSAL | Status: DC | PRN
  Administered 2018-06-30 – 2018-07-01 (×2): 2 mg via ORAL

## 2018-06-30 NOTE — Progress Notes (Signed)
[facility] Group Notes:  (Nursing/MHT/Case Management/Adjunct)  Date:  06/30/2018  Time:  2045 Type of Therapy:  wrap up group  Participation Level:  Did Not Attend  Participation Quality:  did not attend  Affect:  Depressed  Cognitive:  Appropriate  Insight:  Improving  Engagement in Group:  did not attend  Modes of Intervention:  did not attend  Summary of Progress/Problems: Pt did join group but within the first few minutes left group and returned to her room.   Marcille Buffy 06/30/2018, 9:41 PM

## 2018-06-30 NOTE — [facility] Suicide Risk Assessment (Signed)
[facility] INPATIENT:  Family/Significant Other Suicide Prevention Education  Suicide Prevention Education:  Patient Refusal for Family/Significant Other Suicide Prevention Education: The patient Gloria Heath has refused to provide written consent for family/significant other to be provided Family/Significant Other Suicide Prevention Education during admission and/or prior to discharge.  Physician notified.  SPE completed with patient, as patient refused to consent to family contact. Patient was encouraged to share information with support network, ask questions, and talk about any concerns relating to SPE. Patient denies access to guns/firearms and verbalized understanding of information provided. Mobile Crisis information also provided to patient.    Maeola Christie 06/30/2018, 10:51 AM

## 2018-06-30 NOTE — Tx Team (Signed)
Initial Treatment Plan 06/30/2018 12:24 AM [name] ZOX:[id number]    PATIENT STRESSORS: Financial difficulties Legal issue Loss of uncle Marital or family conflict Occupational concerns   PATIENT STRENGTHS: Ability for insight Average or above average intelligence Capable of independent living Communication skills General fund of knowledge Motivation for treatment/growth Physical Health   PATIENT IDENTIFIED PROBLEMS: Depression Anxiety Self harm behaviors "Stop hurting myself"                     DISCHARGE CRITERIA:  Ability to meet basic life and health needs Improved stabilization in mood, thinking, and/or behavior Reduction of life-threatening or endangering symptoms to within safe limits Verbal commitment to aftercare and medication compliance  PRELIMINARY DISCHARGE PLAN: Attend aftercare/continuing care group  PATIENT/FAMILY INVOLVEMENT: This treatment plan has been presented to and reviewed with the patient, Gloria Heath, and/or family member, .  The patient and family have been given the opportunity to ask questions and make suggestions.  Gloria Heath, [place], [place] 06/30/2018, 12:24 AM
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
## 2018-06-30 NOTE — Tx Team (Addendum)
Interdisciplinary Treatment and Diagnostic Plan Update  07/01/2018 Time of Session: Gloria Heath MRN: [id number]  Principal Diagnosis: <principal problem not specified>  Secondary Diagnoses: Active Problems:   Severe recurrent major depression without psychotic features (HCC)   Posttraumatic stress disorder   Borderline personality disorder (HCC)   Current Medications:  Current Facility-Administered Medications  Medication Dose Route Frequency Provider Last Rate Last Dose  . alum & mag hydroxide-simeth (MAALOX/MYLANTA) 200-200-20 MG/5ML suspension 30 mL  30 mL Oral Q4H PRN Lindon Romp A, NP      . chlorproMAZINE (THORAZINE) tablet 10 mg  10 mg Oral TID Sharma Covert, MD   10 mg at 06/30/18 1726  . gabapentin (NEURONTIN) tablet 600 mg  600 mg Oral TID Lindon Romp A, NP   600 mg at 06/30/18 1726  . hydrOXYzine (ATARAX/VISTARIL) tablet 25 mg  25 mg Oral TID PRN Lindon Romp A, NP   25 mg at 06/30/18 0001  . ibuprofen (ADVIL,MOTRIN) tablet 800 mg  800 mg Oral Q6H PRN Lindon Romp A, NP   800 mg at 06/30/18 0001  . magnesium hydroxide (MILK OF MAGNESIA) suspension 30 mL  30 mL Oral Daily PRN Lindon Romp A, NP      . nicotine polacrilex (NICORETTE) gum 2 mg  2 mg Oral PRN Sharma Covert, MD   2 mg at 06/30/18 1208  . silver sulfADIAZINE (SILVADENE) 1 % cream   Topical BID Lindon Romp A, NP      . traZODone (DESYREL) tablet 50 mg  50 mg Oral QHS PRN Rozetta Nunnery, NP   50 mg at 06/30/18 2136   PTA Medications: Medications Prior to Admission  Medication Sig Dispense Refill Last Dose  . amphetamine-dextroamphetamine (ADDERALL) 20 MG tablet Take 20 mg by mouth 3 (three) times daily.    Past Week at Unknown time  . gabapentin (NEURONTIN) 600 MG tablet Take 1,200 mg by mouth 3 (three) times daily.   12 Past Week at Unknown time  . nitrofurantoin, macrocrystal-monohydrate, (MACROBID) 100 MG capsule Take 100 mg by mouth 2 (two) times daily. For UTI if needed   Past Month at Unknown  time    Patient Stressors: Financial difficulties Legal issue Loss of uncle Marital or family conflict Occupational concerns  Patient Strengths: Ability for insight Average or above average intelligence Capable of independent living Curator fund of knowledge Motivation for treatment/growth Physical Health  Treatment Modalities: Medication Management, Group therapy, Case management,  1 to 1 session with clinician, Psychoeducation, Recreational therapy.   Physician Treatment Plan for Primary Diagnosis: <principal problem not specified> Long Term Goal(s): Improvement in symptoms so as ready for discharge Improvement in symptoms so as ready for discharge   Short Term Goals: Ability to identify changes in lifestyle to reduce recurrence of condition will improve Ability to verbalize feelings will improve Ability to disclose and discuss suicidal ideas Ability to demonstrate self-control will improve Ability to identify and develop effective coping behaviors will improve Ability to maintain clinical measurements within normal limits will improve Ability to identify triggers associated with substance abuse/mental health issues will improve Ability to identify changes in lifestyle to reduce recurrence of condition will improve Ability to verbalize feelings will improve Ability to disclose and discuss suicidal ideas Ability to demonstrate self-control will improve Ability to identify and develop effective coping behaviors will improve Ability to maintain clinical measurements within normal limits will improve Ability to identify triggers associated with substance abuse/mental health issues will improve  Medication  Management: Evaluate patient's response, side effects, and tolerance of medication regimen.  Therapeutic Interventions: 1 to 1 sessions, Unit Group sessions and Medication administration.  Evaluation of Outcomes: Not Met  Physician Treatment Plan for  Secondary Diagnosis: Active Problems:   Severe recurrent major depression without psychotic features ([place])   Posttraumatic stress disorder   Borderline personality disorder ([place])  Long Term Goal(s): Improvement in symptoms so as ready for discharge Improvement in symptoms so as ready for discharge   Short Term Goals: Ability to identify changes in lifestyle to reduce recurrence of condition will improve Ability to verbalize feelings will improve Ability to disclose and discuss suicidal ideas Ability to demonstrate self-control will improve Ability to identify and develop effective coping behaviors will improve Ability to maintain clinical measurements within normal limits will improve Ability to identify triggers associated with substance abuse/mental health issues will improve Ability to identify changes in lifestyle to reduce recurrence of condition will improve Ability to verbalize feelings will improve Ability to disclose and discuss suicidal ideas Ability to demonstrate self-control will improve Ability to identify and develop effective coping behaviors will improve Ability to maintain clinical measurements within normal limits will improve Ability to identify triggers associated with substance abuse/mental health issues will improve     Medication Management: Evaluate patient's response, side effects, and tolerance of medication regimen.  Therapeutic Interventions: 1 to 1 sessions, Unit Group sessions and Medication administration.  Evaluation of Outcomes: Not Met   RN Treatment Plan for Primary Diagnosis: <principal problem not specified> Long Term Goal(s): Knowledge of disease and therapeutic regimen to maintain health will improve  Short Term Goals: Ability to identify and develop effective coping behaviors will improve and Compliance with prescribed medications will improve  Medication Management: RN will administer medications as ordered by provider, will assess and  evaluate patient's response and provide education to patient for prescribed medication. RN will report any adverse and/or side effects to prescribing provider.  Therapeutic Interventions: 1 on 1 counseling sessions, Psychoeducation, Medication administration, Evaluate responses to treatment, Monitor vital signs and CBGs as ordered, Perform/monitor CIWA, COWS, AIMS and Fall Risk screenings as ordered, Perform wound care treatments as ordered.  Evaluation of Outcomes: Not Met   LCSW Treatment Plan for Primary Diagnosis: <principal problem not specified> Long Term Goal(s): Safe transition to appropriate next level of care at discharge, Engage patient in therapeutic group addressing interpersonal concerns.  Short Term Goals: Engage patient in aftercare planning with referrals and resources, Increase social support and Increase skills for wellness and recovery  Therapeutic Interventions: Assess for all discharge needs, 1 to 1 time with Social worker, Explore available resources and support systems, Assess for adequacy in community support network, Educate family and significant other(s) on suicide prevention, Complete Psychosocial Assessment, Interpersonal group therapy.  Evaluation of Outcomes: Not Met   Progress in Treatment: Attending groups: No. Participating in groups: No. Taking medication as prescribed: Yes. Toleration medication: Yes. Family/Significant other contact made: No, will contact:  when given permission Patient understands diagnosis: Yes. Discussing patient identified problems/goals with staff: Yes. Medical problems stabilized or resolved: Yes. Denies suicidal/homicidal ideation: Yes. Issues/concerns per patient self-inventory: No. Other: none  New problem(s) identified: No, Describe:  none  New Short Term/Long Term Goal(s):  Patient Goals:  "to figure out if anything is wrong with me."  Discharge Plan or Barriers:   Reason for Continuation of Hospitalization:  Depression Medication stabilization  Estimated Length of Stay: 3-5 days.  Attendees: Patient: Gloria Heath 06/30/2018   Physician: Dr  Mallie Darting, MD 06/30/2018   Nursing: Darrol Angel, RN 06/30/2018   RN Care Manager: 06/30/2018   Social Worker: Lurline Idol, LCSW 06/30/2018   Recreational Therapist:  06/30/2018   Other:  06/30/2018   Other:  06/30/2018   Other: 06/30/2018        Scribe for Treatment Team: Joanne Chars, LCSW 07/01/2018 7:59 AM

## 2018-06-30 NOTE — BHH Group Notes (Signed)
[facility], [employer] Group Therapy 06/30/2018 1:15pm  Type of Therapy: Mental [employer] Presentation  Participation Level: Active  Participation Quality: Attentive  Affect: Appropriate  Cognitive: Oriented  Insight: Developing/Improving  Engagement in Therapy: Engaged  Modes of Intervention: Discussion, Education and Socialization  Summary of Progress/Problems: Mental [employer] (MHA) Speaker came to talk about his personal journey with mental health. The pt processed ways by which to relate to the speaker. MHA speaker provided handouts and educational information pertaining to groups and services offered by the [facility]. Pt was engaged in speaker's presentation and was receptive to resources provided.    Rona Ravens, LCSW 06/30/2018 11:15 AM
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
## 2018-06-30 NOTE — Progress Notes (Signed)
DAR NOTE: Patient presents with anxious affect and depressed mood.  Pt has been isolative, stayed in the room most of the time. Pt reports good sleep, fair appetite, high energy, and poor concentration. Denies pain, auditory and visual hallucinations.  Rates depression at 8, hopelessness at 7, and anxiety at 0.  Maintained on routine safety checks.  Medications given as prescribed.  Support and encouragement offered as needed.  Attended group and participated.  States goal for today is " I just want to know if there is something wrong with me." Will continue to monitor.

## 2018-06-30 NOTE — [facility] Group Notes (Signed)
Pt was invited but did not attend group. 

## 2018-07-01 NOTE — Progress Notes (Signed)
Patient refused thorazine 10 mg this morning.  Patient stated this med is not working for her.  She wants her neurotin 1200 mg tid which is her home medicine.  Stated she feels terrible today.

## 2018-07-01 NOTE — [facility] Group Notes (Signed)
[facility] LCSW Group Therapy Note  Date/Time: 07/01/18, 1315  Type of Therapy/Topic:  Group Therapy:  Balance in Life  Participation Level:  Did not attend  Description of Group:    This group will address the concept of balance and how it feels and looks when one is unbalanced. Patients will be encouraged to process areas in their lives that are out of balance, and identify reasons for remaining unbalanced. Facilitators will guide patients utilizing problem- solving interventions to address and correct the stressor making their life unbalanced. Understanding and applying boundaries will be explored and addressed for obtaining  and maintaining a balanced life. Patients will be encouraged to explore ways to assertively make their unbalanced needs known to significant others in their lives, using other group members and facilitator for support and feedback.  Therapeutic Goals: 1. Patient will identify two or more emotions or situations they have that consume much of in their lives. 2. Patient will identify signs/triggers that life has become out of balance:  3. Patient will identify two ways to set boundaries in order to achieve balance in their lives:  4. Patient will demonstrate ability to communicate their needs through discussion and/or role plays  Summary of Patient Progress:          Therapeutic Modalities:   Cognitive Behavioral Therapy Solution-Focused Therapy Assertiveness Training  Daleen Squibb, LCSW

## 2018-07-01 NOTE — Progress Notes (Signed)
This evening the pt stated that she has a rash. Pt states "rash is raised painful bumps that bust open before starting to go away." Rash is located on right thigh, right buttock, and lower abdomin. Pt is very concerned about the development of rash. I told the pt that I would notify the proper individual after speaking to my preceptor.

## 2018-07-01 NOTE — Progress Notes (Signed)
Adult Psychoeducational Group Note  Date:  07/01/2018 Time:  10:17 AM  Group Topic/Focus:  Goals Group:   The focus of this group is to help patients establish daily goals to achieve during treatment and discuss how the patient can incorporate goal setting into their daily lives to aide in recovery.  Participation Level:  Active  Participation Quality:  Appropriate  Affect:  Appropriate  Cognitive:  Alert and Appropriate  Insight: Appropriate, Good and Improving  Engagement in Group:  Engaged  Modes of Intervention:  Discussion  Additional Comments:  Pt attended group and participated in group discussion.  [name] R [name] 07/01/2018, 10:17 AM

## 2018-07-01 NOTE — Progress Notes (Signed)
D: Pt is alert and oriented. Pt presents in a irritable mood on assessment stating that "she's pissed off and wants her full dose of gabapentin." Pt states "she slept okay." Pt denies SI/HI, AVH, and pain at this time.  A: Schedule medications administered to pt, per MD orders. Support and encouragement provided. Routine safety checks conducted every 15 minutes.  R: No adverse drug reactions noted. Pt contracts for safety at this time. Pt remains safe at this time.

## 2018-07-01 NOTE — Progress Notes (Signed)
Adult Psychoeducational Group Note  Date:  07/01/2018 Time: 1600  Group Topic/Focus:  Coping With Mental Health Crisis:   The purpose of this group is to help patients identify strategies for coping with mental health crisis.  Group discusses possible causes of crisis and ways to manage them effectively.  Participation Level:  Did not attend.

## 2018-07-01 NOTE — Progress Notes (Signed)
Patient ID: Gloria Heath, female   DOB: 19-Apr-1986, 32 y.o.   MRN: [id number]   D: Patient pleasant on approach tonight. No overt psychosis. Reports that she has been getting irritated with family/friends due to them thinking she needs to be on a lot of medications when she believes that her stressors/situations have caused her depression. Reports some sedation from medication. Took Trazodone tonight. A: Staff will continue to monitor on q 15 minute checks, follow treatment plan, and give medications as needed. R: Cooperative on the unit.

## 2018-07-01 NOTE — Progress Notes (Signed)
[facility] MD Progress Note  07/01/2018 12:51 PM Gloria Heath  MRN:  [id number] Subjective: Patient is seen and examined.  Patient is a 32 year old female with a past psychiatric history significant for posttraumatic stress disorder, depression, anxiety and borderline personality disorder.  She is seen in follow-up.  Unfortunately the Thorazine is been way too sedating for her.  She is been unable to tolerate it.  Her mood and affect are improved today.  She denied any suicidal ideation.  The majority of the discussion today had to do with potentially entering a better woman shelter or some other housing option besides going to stay with a friend of hers.  She is in agreement with trying that.  She denied any current suicidal ideation. Principal Problem: <principal problem not specified> Diagnosis:   Patient Active Problem List   Diagnosis Date Noted  . Posttraumatic stress disorder [F43.10]   . Borderline personality disorder ([place]) [F60.3]   . Severe recurrent major depression without psychotic features ([place]) [F33.2] 06/29/2018   Total Time spent with patient: 15 minutes  Past Psychiatric History: See admission H&P  Past Medical History:  Past Medical History:  Diagnosis Date  . Anxiety   . Depression   . HPV (human papilloma virus) infection 2006   cleared on it's own, no treatment.    History reviewed. No pertinent surgical history. Family History:  Family History  Problem Relation Age of Onset  . Polycystic ovary syndrome Sister   . Polycystic ovary syndrome Maternal Grandmother   . Cancer Maternal Grandmother        lung  . Cancer Paternal Grandfather        lung   Family Psychiatric  History: See admission H&P Social History:  Social History   Substance and Sexual Activity  Alcohol Use Yes  . Alcohol/week: 4.0 standard drinks  . Types: 2 Glasses of wine, 2 Cans of beer per week   Comment: social     Social History   Substance and Sexual Activity  Drug Use No    Social  History   Socioeconomic History  . Marital status: Divorced    Spouse name: Not on file  . Number of children: Not on file  . Years of education: Not on file  . Highest education level: Not on file  Occupational History  . Not on file  Social Needs  . Financial resource strain: Not on file  . Food insecurity:    Worry: Not on file    Inability: Not on file  . Transportation needs:    Medical: Not on file    Non-medical: Not on file  Tobacco Use  . Smoking status: Current Every Day Smoker    Packs/day: 1.00    Types: Cigarettes  . Smokeless tobacco: Never Used  Substance and Sexual Activity  . Alcohol use: Yes    Alcohol/week: 4.0 standard drinks    Types: 2 Glasses of wine, 2 Cans of beer per week    Comment: social  . Drug use: No  . Sexual activity: Yes    Birth control/protection: Inserts  Lifestyle  . Physical activity:    Days per week: Not on file    Minutes per session: Not on file  . Stress: Not on file  Relationships  . Social connections:    Talks on phone: Not on file    Gets together: Not on file    Attends religious service: Not on file    Active member of club or organization: Not  on file    Attends meetings of clubs or organizations: Not on file    Relationship status: Not on file  Other Topics Concern  . Not on file  Social History Narrative  . Not on file   Additional Social History:    Pain Medications: See MAR Prescriptions: See MAR Over the Counter: See MAR History of alcohol / drug use?: Yes Name of Substance 1: Cannabis 1 - Age of First Use: teens 1 - Amount (size/oz): varies 1 - Frequency: daily 1 - Duration: ongoing 1 - Last Use / Amount: 06/29/18 Name of Substance 2: Tobacco 2 - Age of First Use: teens 2 - Amount (size/oz): daily 2 - Frequency: up to 1 pack 2 - Duration: ongoing 2 - Last Use / Amount: 06/29/18                Sleep: Fair  Appetite:  Fair  Current Medications: Current Facility-Administered  Medications  Medication Dose Route Frequency Provider Last Rate Last Dose  . alum & mag hydroxide-simeth (MAALOX/MYLANTA) 200-200-20 MG/5ML suspension 30 mL  30 mL Oral Q4H PRN Lindon Romp A, NP      . chlorproMAZINE (THORAZINE) tablet 10 mg  10 mg Oral TID Sharma Covert, MD   10 mg at 06/30/18 1726  . gabapentin (NEURONTIN) tablet 600 mg  600 mg Oral TID Lindon Romp A, NP   600 mg at 07/01/18 0843  . hydrOXYzine (ATARAX/VISTARIL) tablet 25 mg  25 mg Oral TID PRN Rozetta Nunnery, NP   25 mg at 07/01/18 0846  . ibuprofen (ADVIL,MOTRIN) tablet 800 mg  800 mg Oral Q6H PRN Lindon Romp A, NP   800 mg at 06/30/18 0001  . magnesium hydroxide (MILK OF MAGNESIA) suspension 30 mL  30 mL Oral Daily PRN Lindon Romp A, NP      . nicotine polacrilex (NICORETTE) gum 2 mg  2 mg Oral PRN Sharma Covert, MD   2 mg at 07/01/18 0855  . silver sulfADIAZINE (SILVADENE) 1 % cream   Topical BID Lindon Romp A, NP      . traZODone (DESYREL) tablet 50 mg  50 mg Oral QHS PRN Rozetta Nunnery, NP   50 mg at 06/30/18 2136    Lab Results:  Results for orders placed or performed during the hospital encounter of 06/29/18 (from the past 48 hour(s))  CBC     Status: Abnormal   Collection Time: 06/30/18  6:46 AM  Result Value Ref Range   WBC 9.9 4.0 - 10.5 K/uL   RBC 4.01 3.87 - 5.11 MIL/uL   Hemoglobin 13.4 12.0 - 15.0 g/dL   HCT 40.7 36.0 - 46.0 %   MCV 101.5 (H) 78.0 - 100.0 fL   MCH 33.4 26.0 - 34.0 pg   MCHC 32.9 30.0 - 36.0 g/dL   RDW 13.0 11.5 - 15.5 %   Platelets 234 150 - 400 K/uL    Comment: Performed at [facility], [place] [address] [place], [place] [address]  Comprehensive metabolic panel     Status: Abnormal   Collection Time: 06/30/18  6:46 AM  Result Value Ref Range   Sodium 142 135 - 145 mmol/L   Potassium 3.9 3.5 - 5.1 mmol/L   Chloride 105 98 - 111 mmol/L   CO2 29 22 - 32 mmol/L   Glucose, Bld 108 (H) 70 - 99 mg/dL   BUN 13 6 - 20 mg/dL   Creatinine, Ser 0.71 0.44 -  1.00  mg/dL   Calcium 9.2 8.9 - 10.3 mg/dL   Total Protein 6.8 6.5 - 8.1 g/dL   Albumin 3.4 (L) 3.5 - 5.0 g/dL   AST 14 (L) 15 - 41 U/L   ALT 15 0 - 44 U/L   Alkaline Phosphatase 71 38 - 126 U/L   Total Bilirubin 0.5 0.3 - 1.2 mg/dL   GFR calc non Af Amer >60 >60 mL/min   GFR calc Af Amer >60 >60 mL/min    Comment: (NOTE) The eGFR has been calculated using the CKD EPI equation. This calculation has not been validated in all clinical situations. eGFR's persistently <60 mL/min signify possible Chronic Kidney Disease.    Anion gap 8 5 - 15    Comment: Performed at [facility], [place] [address], [place], [place] [address]  Hemoglobin A1c     Status: Abnormal   Collection Time: 06/30/18  6:46 AM  Result Value Ref Range   Hgb A1c MFr Bld 5.9 (H) 4.8 - 5.6 %    Comment: (NOTE) Pre diabetes:          5.7%-6.4% Diabetes:              >6.4% Glycemic control for   <7.0% adults with diabetes    Mean Plasma Glucose 122.63 mg/dL    Comment: Performed at [facility] [address] [place], [place] [address]  Lipid panel     Status: Abnormal   Collection Time: 06/30/18  6:46 AM  Result Value Ref Range   Cholesterol 136 0 - 200 mg/dL   Triglycerides 70 <150 mg/dL   HDL 35 (L) >40 mg/dL   Total CHOL/HDL Ratio 3.9 RATIO   VLDL 14 0 - 40 mg/dL   LDL Cholesterol 87 0 - 99 mg/dL    Comment:        Total Cholesterol/HDL:CHD Risk Coronary Heart Disease Risk Table                     Men   Women  1/2 Average Risk   3.4   3.3  Average Risk       5.0   4.4  2 X Average Risk   9.6   7.1  3 X Average Risk  23.4   11.0        Use the calculated Patient Ratio above and the CHD Risk Table to determine the patient's CHD Risk.        ATP III CLASSIFICATION (LDL):  <100     mg/dL   Optimal  100-129  mg/dL   Near or Above                    Optimal  130-159  mg/dL   Borderline  160-189  mg/dL   High  >190     mg/dL   Very High Performed at [facility] [address], [place], [place] [address]   TSH     Status: None   Collection Time: 06/30/18  6:46 AM  Result Value Ref Range   TSH 1.660 0.350 - 4.500 uIU/mL    Comment: Performed by a 3rd Generation assay with a functional sensitivity of <=0.01 uIU/mL. Performed at [facility], [place] [address], [place],  [address]   Pregnancy, urine     Status: None   Collection Time: 06/30/18 12:18 PM  Result Value Ref Range   Preg Test, Ur NEGATIVE NEGATIVE  Comment:        THE SENSITIVITY OF THIS METHODOLOGY IS >20 mIU/mL. Performed at [facility], [place] [address] [place], [place] [address]   Urine rapid drug screen (hosp performed)not at [facility]     Status: Abnormal   Collection Time: 06/30/18 12:18 PM  Result Value Ref Range   Opiates NONE DETECTED NONE DETECTED   Cocaine NONE DETECTED NONE DETECTED   Benzodiazepines NONE DETECTED NONE DETECTED   Amphetamines POSITIVE (A) NONE DETECTED   Tetrahydrocannabinol POSITIVE (A) NONE DETECTED   Barbiturates NONE DETECTED NONE DETECTED    Comment: (NOTE) DRUG SCREEN FOR MEDICAL PURPOSES ONLY.  IF CONFIRMATION IS NEEDED FOR ANY PURPOSE, NOTIFY LAB WITHIN 5 DAYS. LOWEST DETECTABLE LIMITS FOR URINE DRUG SCREEN Drug Class                     Cutoff (ng/mL) Amphetamine and metabolites    1000 Barbiturate and metabolites    200 Benzodiazepine                 951 Tricyclics and metabolites     300 Opiates and metabolites        300 Cocaine and metabolites        300 THC                            50 Performed at [facility], [place] [address] [place], [place] [address]     Blood Alcohol level:  No results found for: [facility]  Metabolic Disorder Labs: Lab Results  Component Value Date   HGBA1C 5.9 (H) 06/30/2018   MPG 122.63 06/30/2018   No results found for: PROLACTIN Lab Results  Component Value Date   CHOL 136 06/30/2018   TRIG 70 06/30/2018   HDL 35 (L)  06/30/2018   CHOLHDL 3.9 06/30/2018   VLDL 14 06/30/2018   LDLCALC 87 06/30/2018    Physical Findings: AIMS: Facial and Oral Movements Muscles of Facial Expression: None, normal Lips and Perioral Area: None, normal Jaw: None, normal Tongue: None, normal,Extremity Movements Upper (arms, wrists, hands, fingers): None, normal Lower (legs, knees, ankles, toes): None, normal, Trunk Movements Neck, shoulders, hips: None, normal, Overall Severity Severity of abnormal movements (highest score from questions above): None, normal Incapacitation due to abnormal movements: None, normal Patient's awareness of abnormal movements (rate only patient's report): No Awareness, Dental Status Current problems with teeth and/or dentures?: No Does patient usually wear dentures?: No  CIWA:    COWS:     Musculoskeletal: Strength & Muscle Tone: within normal limits Gait & Station: normal Patient leans: N/A  Psychiatric Specialty Exam: Physical Exam  Nursing note and vitals reviewed. Constitutional: She is oriented to person, place, and time. She appears well-developed and well-nourished.  HENT:  Head: Normocephalic and atraumatic.  Respiratory: Effort normal.  Neurological: She is alert and oriented to person, place, and time.    ROS  Blood pressure (!) 148/85, pulse 74, temperature 98.2 F (36.8 C), temperature source Oral, resp. rate 20, height _0  (1.651 m), weight 88 kg.Body mass index is 32.28 kg/m.  General Appearance: Disheveled  Eye Contact:  Fair  Speech:  Normal Rate  Volume:  Decreased  Mood:  Anxious  Affect:  Congruent  Thought Process:  Coherent and Descriptions of Associations: Intact  Orientation:  Full (Time, Place, and Person)  Thought Content:  Logical  Suicidal Thoughts:  No  Homicidal Thoughts:  No  Memory:  Immediate;   Fair Recent;   Fair Remote;   Fair  Judgement:  Fair  Insight:  Fair  Psychomotor Activity:  Increased  Concentration:  Concentration: Fair and  Attention Span: Fair  Recall:  [employer] of Knowledge:  Fair  Language:  Fair  Akathisia:  Negative  Handed:  Right  AIMS (if indicated):     Assets:  Communication Skills Desire for Improvement Housing Physical Health Resilience Social Support  ADL's:  Intact  Cognition:  WNL  Sleep:  Number of Hours: 4.75     Treatment Plan Summary: Daily contact with patient to assess and evaluate symptoms and progress in treatment, Medication management and Plan : Patient is seen and examined.  Patient is a 32 year old female with the above-stated past psychiatric history who is seen in follow-up.  #1 PTSD/depression/anxiety-she was unable to tolerate the Thorazine.  That will be stopped today.  We will continue the gabapentin.  She will continue in therapy as an outpatient.  She denied any current suicidal ideation.  The majority of today was discussing the possibility of going into a better woman shelter at least temporarily.  #2 disposition-most likely discharge tomorrow and hopefully we will arrange her to be able to stay at some form of a woman shelter.  Sharma Covert, MD 07/01/2018, 12:51 PM

## 2018-07-02 MED ORDER — TRAZODONE HCL 50 MG PO TABS: 50.0000 mg | ORAL_TABLET | Freq: Every evening | ORAL | 0 refills | Status: DC | PRN

## 2018-07-02 MED ORDER — SILVER SULFADIAZINE 1 % EX CREA: TOPICAL_CREAM | Freq: Two times a day (BID) | CUTANEOUS | 0 refills | Status: DC

## 2018-07-02 MED ORDER — HYDROXYZINE HCL 25 MG PO TABS: 25.0000 mg | ORAL_TABLET | Freq: Three times a day (TID) | ORAL | 0 refills | Status: DC | PRN

## 2018-07-02 MED ORDER — GABAPENTIN 600 MG PO TABS: 600.0000 mg | ORAL_TABLET | Freq: Three times a day (TID) | ORAL | 0 refills | Status: DC

## 2018-07-02 MED ORDER — BACITRACIN-NEOMYCIN-POLYMYXIN OINTMENT TUBE
TOPICAL_OINTMENT | Freq: Every day | CUTANEOUS | Status: DC
  Administered 2018-07-02: 13:00:00 via TOPICAL
  Filled 2018-07-02: qty 14.17

## 2018-07-02 NOTE — Progress Notes (Signed)
D: Pt was in dayroom upon initial approach.  Pt presents with anxious affect and mood.  She reports her day has "been all right" and her goal is "to sleep."  Pt reports she had a good visit with her boyfriend tonight.  Pt denies SI/HI, denies hallucinations, denies pain.  Pt has been visible in milieu interacting with others appropriately.  Pt attended evening group.    A: Introduced self to pt.  Actively listened to pt and offered support and encouragement. PRN medication administered for anxiety and sleep.  Q15 minute safety checks maintained.  R: Pt is safe on the unit.  Pt is compliant with medications.  Pt verbally contracts for safety.  Will continue to monitor and assess.
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
## 2018-07-02 NOTE — [facility] Group Notes (Deleted)
[facility] Group Notes:  (Nursing/MHT/Case Management/Adjunct)  Date:  07/02/2018  Time:  4:00 PM  Type of Therapy:  Nurse Education  Participation Level:  Did Not Attend    Gloria Heath 07/02/2018, 5:56 PM

## 2018-07-02 NOTE — Progress Notes (Signed)
Recreation Therapy Notes  Date: 9.27.19 Time: 0930 Location: 300 [address] Dayroom  Group Topic: Stress Management  Goal Area(s) Addresses:  Patient will verbalize importance of using healthy stress management.  Patient will identify positive emotions associated with healthy stress management.   Intervention: Stress Management  Activity :  Meditation.  LRT introduced the stress management technique of meditation.  LRT played a meditation that focused on being resilient in the face of obstacles.  Patients were to follow along as meditation played.  Education:  Stress Management, Discharge Planning.   Education Outcome: Acknowledges edcuation/In group clarification offered/Needs additional education  Clinical Observations/Feedback: Pt did not attend group.      Caroll Rancher, LRT/CTRS         Caroll Rancher A 07/02/2018 12:54 PM

## 2018-07-02 NOTE — [facility] Suicide Risk Assessment (Signed)
[facility] Discharge Suicide Risk Assessment   Principal Problem: <principal problem not specified> Discharge Diagnoses:  Patient Active Problem List   Diagnosis Date Noted  . Posttraumatic stress disorder [F43.10]   . Borderline personality disorder (HCC) [F60.3]   . Severe recurrent major depression without psychotic features (HCC) [F33.2] 06/29/2018    Total Time spent with patient: 15 minutes  Musculoskeletal: Strength & Muscle Tone: within normal limits Gait & Station: normal Patient leans: N/A  Psychiatric Specialty Exam: Review of Systems  All other systems reviewed and are negative.   Blood pressure (!) 148/85, pulse 74, temperature 98.2 F (36.8 C), temperature source Oral, resp. rate 20, height 5\' 5"  (1.651 m), weight 88 kg.Body mass index is 32.28 kg/m.  General Appearance: Casual  Eye Contact::  Fair  Speech:  Normal Rate409  Volume:  Normal  Mood:  Euthymic  Affect:  Congruent  Thought Process:  Coherent and Descriptions of Associations: Intact  Orientation:  Full (Time, Place, and Person)  Thought Content:  Logical  Suicidal Thoughts:  No  Homicidal Thoughts:  No  Memory:  Immediate;   Fair Recent;   Fair Remote;   Fair  Judgement:  Fair  Insight:  Fair  Psychomotor Activity:  Normal  Concentration:  Fair  Recall:  [employer] of Knowledge:Fair  Language: Fair  Akathisia:  Yes  Handed:  Right  AIMS (if indicated):     Assets:  Communication Skills Desire for Improvement Physical Health Resilience Social Support  Sleep:  Number of Hours: 6.75  Cognition: WNL  ADL's:  Intact   Mental Status Per Nursing Assessment::   On Admission:  Suicidal ideation indicated by patient, Self-harm thoughts  Demographic Factors:  Divorced or widowed, Caucasian, Low socioeconomic status and Unemployed  Loss Factors: Financial problems/change in socioeconomic status  Historical Factors: Impulsivity  Risk Reduction Factors:   Living with another person,  especially a relative  Continued Clinical Symptoms:  Depression:   Impulsivity Personality Disorders:   Cluster B  Cognitive Features That Contribute To Risk:  None    Suicide Risk:  Minimal: No identifiable suicidal ideation.  Patients presenting with no risk factors but with morbid ruminations; may be classified as minimal risk based on the severity of the depressive symptoms  Follow-up Information    Milagros Evener, MD. Go on 07/06/2018.   Specialty:  Psychiatry Why:  Appointment for medication management is Tuesday, 07/06/18 at 6:15pm. Please be sure to bring any discharge paperwork from this hospitalization.  Contact information: [address]Tyson [address]inski [place] [address] [phone]        [facility] Follow up.           Plan Of Care/Follow-up recommendations:  Activity:  ad lib  Antonieta Pert, MD 07/02/2018, 8:15 AM
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
## 2018-07-05 NOTE — Progress Notes (Signed)
CSW received call back from [facility] stating they cannot give any info to CSW without a release.  CSW no longer has release as it has been submitted to medical records at pt discharge.  CSW called pt at [phone] and asked her to contact [facility] and to confirm with CSW that she has obtained a follow up appt. Garner Nash, MSW, LCSW Clinical Social Worker 07/05/2018 1:04 PM

## 2018-07-28 ENCOUNTER — Emergency Department (HOSPITAL_COMMUNITY)
Admission: EM | Admit: 2018-07-28 | Discharge: 2018-07-28 | Disposition: A | Discharge status: Home / Self Care | Payer: Managed Care, Other (non HMO) | Attending: Emergency Medicine | Admitting: Emergency Medicine

## 2018-07-28 ENCOUNTER — Emergency Department (HOSPITAL_COMMUNITY): Payer: Managed Care, Other (non HMO)

## 2018-07-28 ENCOUNTER — Encounter (HOSPITAL_COMMUNITY): Payer: Self-pay | Admitting: Emergency Medicine

## 2018-07-28 ENCOUNTER — Other Ambulatory Visit: Payer: Self-pay

## 2018-07-28 DIAGNOSIS — W19XXXA Unspecified fall, initial encounter: Secondary | ICD-10-CM | POA: Diagnosis not present

## 2018-07-28 DIAGNOSIS — S8991XA Unspecified injury of right lower leg, initial encounter: Secondary | ICD-10-CM | POA: Diagnosis present

## 2018-07-28 DIAGNOSIS — Y929 Unspecified place or not applicable: Secondary | ICD-10-CM | POA: Insufficient documentation

## 2018-07-28 DIAGNOSIS — S93601A Unspecified sprain of right foot, initial encounter: Secondary | ICD-10-CM | POA: Insufficient documentation

## 2018-07-28 DIAGNOSIS — F17210 Nicotine dependence, cigarettes, uncomplicated: Secondary | ICD-10-CM | POA: Diagnosis not present

## 2018-07-28 DIAGNOSIS — Z79899 Other long term (current) drug therapy: Secondary | ICD-10-CM | POA: Diagnosis not present

## 2018-07-28 DIAGNOSIS — Y999 Unspecified external cause status: Secondary | ICD-10-CM | POA: Insufficient documentation

## 2018-07-28 DIAGNOSIS — Y939 Activity, unspecified: Secondary | ICD-10-CM | POA: Insufficient documentation

## 2018-07-28 DIAGNOSIS — S93401A Sprain of unspecified ligament of right ankle, initial encounter: Secondary | ICD-10-CM

## 2018-07-28 NOTE — ED Triage Notes (Signed)
Right foot/ankle pain post falling down stairs yesterday.

## 2018-07-28 NOTE — ED Notes (Signed)
Patient verbalized understanding of discharge instructions, no questions. Patient out of ED via wheelchair with crutches in no distress.  

## 2018-07-28 NOTE — ED Provider Notes (Signed)
[facility]-EMERGENCY DEPT Provider Note   CSN: [id number] Arrival date & time: 07/28/18  4098     History   Chief Complaint Chief Complaint  Patient presents with  . Foot Pain  . Ankle Pain    HPI Vivian Neuwirth is a 32 y.o. female.  The history is provided by the patient.  Ankle Pain   The incident occurred yesterday. The injury mechanism was a fall. The pain is present in the right ankle and right foot. The quality of the pain is described as aching. The pain is at a severity of 1/10. The pain is mild. The pain has been fluctuating since onset. Pertinent negatives include no numbness, no inability to bear weight, no loss of motion, no muscle weakness, no loss of sensation and no tingling. She has tried nothing for the symptoms. The treatment provided no relief.    Past Medical History:  Diagnosis Date  . Anxiety   . Depression   . HPV (human papilloma virus) infection 2006   cleared on it's own, no treatment.     Patient Active Problem List   Diagnosis Date Noted  . Posttraumatic stress disorder   . Borderline personality disorder (HCC)   . 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 No pertinent surgical history.   OB History    Gravida  2   Para  1   Term  1   Preterm      AB  0   Living  1     SAB      TAB  0   Ectopic      Multiple      Live Births               Home Medications    Prior to Admission medications   Medication Sig Start Date End Date Taking? Authorizing Provider  acetaminophen (TYLENOL) 500 MG tablet Take 1,000 mg by mouth every 6 (six) hours as needed for mild pain.   Yes [provider]  amphetamine-dextroamphetamine (ADDERALL) 20 MG tablet Take 20 mg by mouth 3 (three) times daily. 07/16/18  Yes [provider]  gabapentin (NEURONTIN) 600 MG tablet Take 1 tablet (600 mg total) by mouth 3 (three) times daily. For mood control  07/02/18  Yes Money, Gerlene Burdock, FNP  hydrOXYzine (ATARAX/VISTARIL) 25 MG tablet Take 1 tablet (25 mg total) by mouth 3 (three) times daily as needed for anxiety. Patient not taking: Reported on 07/28/2018 07/02/18   Money, Gerlene Burdock, FNP  silver sulfADIAZINE (SILVADENE) 1 % cream Apply topically 2 (two) times daily. Patient not taking: Reported on 07/28/2018 07/02/18   Money, Gerlene Burdock, FNP  traZODone (DESYREL) 50 MG tablet Take 1 tablet (50 mg total) by mouth at bedtime as needed for sleep. Patient not taking: Reported on 07/28/2018 07/02/18   Money, Gerlene Burdock, FNP    Family History Family History  Problem Relation Age of Onset  . Polycystic ovary syndrome Sister   . Polycystic ovary syndrome Maternal Grandmother   . Cancer Maternal Grandmother        lung  . Cancer Paternal Grandfather        lung    Social History Social History   Tobacco Use  . Smoking status: Current Every Day Smoker    Packs/day: 1.00    Types: Cigarettes  . Smokeless tobacco: Never Used  Substance Use Topics  . Alcohol use: Yes    Alcohol/week:  4.0 standard drinks    Types: 2 Glasses of wine, 2 Cans of beer per week    Comment: social  . Drug use: No     Allergies   Patient has no known allergies.   Review of Systems Review of Systems  Constitutional: Negative for chills and fever.  HENT: Negative for ear pain and sore throat.   Eyes: Negative for pain and visual disturbance.  Respiratory: Negative for cough and shortness of breath.   Cardiovascular: Negative for chest pain and palpitations.  Gastrointestinal: Negative for abdominal pain and vomiting.  Genitourinary: Negative for dysuria and hematuria.  Musculoskeletal: Positive for arthralgias and gait problem. Negative for back pain.  Skin: Negative for color change and rash.  Neurological: Negative for tingling, seizures, syncope and numbness.  All other systems reviewed and are negative.    Physical Exam Updated Vital Signs  ED Triage  Vitals [07/28/18 0751]  Enc Vitals Group     BP 137/87     Pulse Rate 83     Resp 20     Temp 98.3 F (36.8 C)     Temp Source Oral     SpO2 97 %     Weight      Height      Head Circumference      Peak Flow      Pain Score 7     Pain Loc      Pain Edu?      Excl. in GC?     Physical Exam  Constitutional: She is oriented to person, place, and time. She appears well-developed and well-nourished. No distress.  HENT:  Head: Normocephalic and atraumatic.  Mouth/Throat: No oropharyngeal exudate.  Eyes: Pupils are equal, round, and reactive to light. Conjunctivae and EOM are normal.  Neck: Normal range of motion. Neck supple.  Cardiovascular: Normal rate, regular rhythm, normal heart sounds and intact distal pulses.  No murmur heard. Pulmonary/Chest: Effort normal and breath sounds normal. No respiratory distress.  Abdominal: Soft. Bowel sounds are normal. She exhibits no distension. There is no tenderness.  Musculoskeletal: Normal range of motion. She exhibits tenderness (TTP to right foot and ankle). She exhibits no edema or deformity.  Neurological: She is alert and oriented to person, place, and time.  Skin: Skin is warm and dry. Capillary refill takes less than 2 seconds.  Psychiatric: She has a normal mood and affect.  Nursing note and vitals reviewed.    ED Treatments / Results  Labs (all labs ordered are listed, but only abnormal results are displayed) Labs Reviewed - No data to display  EKG None  Radiology Dg Ankle Complete Right  Result Date: 07/28/2018 CLINICAL DATA:  Pain following fall EXAM: RIGHT ANKLE - COMPLETE 3+ VIEW COMPARISON:  None. FINDINGS: Frontal, oblique, and lateral views were obtained. There is no evident fracture or joint effusion. There is no appreciable joint space narrowing or erosion. There is a small inferior calcaneal spur. The ankle mortise appears intact. IMPRESSION: Small inferior calcaneal spur. No evident fracture or appreciable  joint space narrowing. Ankle mortise appears intact. Electronically Signed   By: Bretta Bang III M.D.   On: 07/28/2018 08:20   Dg Knee Complete 4 Views Right  Result Date: 07/28/2018 CLINICAL DATA:  Right knee pain after falling down stairs yesterday. EXAM: RIGHT KNEE - COMPLETE 4+ VIEW COMPARISON:  None. FINDINGS: No evidence of fracture, dislocation, or joint effusion. No evidence of arthropathy or other focal bone abnormality. Soft tissues are unremarkable.  IMPRESSION: Negative. Electronically Signed   By: Francene Boyers M.D.   On: 07/28/2018 08:59   Dg Foot Complete Right  Result Date: 07/28/2018 CLINICAL DATA:  Pain following recent fall EXAM: RIGHT FOOT COMPLETE - 3+ VIEW COMPARISON:  None. FINDINGS: Frontal, oblique, and lateral views were obtained. There is no appreciable fracture or dislocation. Joint spaces appear normal. No erosive change. There is a small inferior calcaneal spur. IMPRESSION: Small inferior calcaneal spur. No fracture or dislocation. No appreciable joint space narrowing or erosion. Electronically Signed   By: Bretta Bang III M.D.   On: 07/28/2018 08:21    Procedures Procedures (including critical care time)  Medications Ordered in ED Medications - No data to display   Initial Impression / Assessment and Plan / ED Course  I have reviewed the triage vital signs and the nursing notes.  Pertinent labs & imaging results that were available during my care of the patient were reviewed by me and considered in my medical decision making (see chart for details).     Kaleisha Bhargava is a 32 year old female with history of anxiety and depression who presents to the ED with right ankle and foot pain after fall yesterday.  Patient with normal vitals.  No fever.  Patient rolled her ankle going down the stairs yesterday.  Has had pain since the fall.  Has been able to ambulate with some minor issues.  States that pain is also on her right knee.  She is neurovascularly  and neuromuscularly intact on exam.  No major swelling, no gross deformities.  X-rays obtained show no acute fracture, malalignment. Has small calcaneal bone spur. Patient likely with ankle sprain.  Was given crutches and ankle splint.  Recommend weightbearing as tolerated.  Recommend continued use of Tylenol and Motrin as well as ice, rest, compression.  Patient discharged from ED in good condition.  Recommend follow-up with primary care doctor.  This chart was dictated using voice recognition software.  Despite best efforts to proofread,  errors can occur which can change the documentation meaning.   Final Clinical Impressions(s) / ED Diagnoses   Final diagnoses:  Sprain of right ankle, unspecified ligament, initial encounter  Sprain of right foot, initial encounter    ED Discharge Orders    None       Virgina Norfolk, DO 07/28/18 0911

## 2018-08-12 ENCOUNTER — Other Ambulatory Visit: Payer: Self-pay

## 2018-08-12 ENCOUNTER — Emergency Department (HOSPITAL_COMMUNITY)
Admission: EM | Admit: 2018-08-12 | Discharge: 2018-08-12 | Disposition: A | Discharge status: Home / Self Care | Payer: Managed Care, Other (non HMO) | Attending: Emergency Medicine | Admitting: Emergency Medicine

## 2018-08-12 ENCOUNTER — Encounter (HOSPITAL_COMMUNITY): Payer: Self-pay | Admitting: Emergency Medicine

## 2018-08-12 DIAGNOSIS — F17210 Nicotine dependence, cigarettes, uncomplicated: Secondary | ICD-10-CM | POA: Diagnosis not present

## 2018-08-12 DIAGNOSIS — R21 Rash and other nonspecific skin eruption: Secondary | ICD-10-CM

## 2018-08-12 DIAGNOSIS — Z202 Contact with and (suspected) exposure to infections with a predominantly sexual mode of transmission: Secondary | ICD-10-CM

## 2018-08-12 DIAGNOSIS — R112 Nausea with vomiting, unspecified: Secondary | ICD-10-CM | POA: Diagnosis not present

## 2018-08-12 DIAGNOSIS — Z79899 Other long term (current) drug therapy: Secondary | ICD-10-CM | POA: Diagnosis not present

## 2018-08-12 LAB — I-STAT CHEM 8, ED
BUN: 10 mg/dL (ref 6–20)
Calcium, Ion: 1.22 mmol/L (ref 1.15–1.40)
Chloride: 102 mmol/L (ref 98–111)
Creatinine, Ser: 0.7 mg/dL (ref 0.44–1.00)
Glucose, Bld: 110 mg/dL — ABNORMAL HIGH (ref 70–99)
HCT: 35 % — ABNORMAL LOW (ref 36.0–46.0)
Hemoglobin: 11.9 g/dL — ABNORMAL LOW (ref 12.0–15.0)
Potassium: 3.6 mmol/L (ref 3.5–5.1)
Sodium: 140 mmol/L (ref 135–145)
TCO2: 29 mmol/L (ref 22–32)

## 2018-08-12 LAB — URINALYSIS, ROUTINE W REFLEX MICROSCOPIC
Bilirubin Urine: NEGATIVE
Glucose, UA: NEGATIVE mg/dL
Hgb urine dipstick: NEGATIVE
Ketones, ur: NEGATIVE mg/dL
Nitrite: NEGATIVE
Protein, ur: NEGATIVE mg/dL
Specific Gravity, Urine: 1.018 (ref 1.005–1.030)
pH: 6 (ref 5.0–8.0)

## 2018-08-12 LAB — WET PREP, GENITAL
Sperm: NONE SEEN
Trich, Wet Prep: NONE SEEN
Yeast Wet Prep HPF POC: NONE SEEN

## 2018-08-12 LAB — I-STAT BETA HCG BLOOD, ED (MC, WL, AP ONLY): I-stat hCG, quantitative: <5 m[IU]/mL (ref <5)

## 2018-08-12 MED ORDER — ONDANSETRON HCL 4 MG PO TABS: 4.0000 mg | ORAL_TABLET | Freq: Three times a day (TID) | ORAL | 0 refills | Status: DC | PRN

## 2018-08-12 MED ORDER — RANITIDINE HCL 150 MG PO TABS: 150.0000 mg | ORAL_TABLET | Freq: Two times a day (BID) | ORAL | 0 refills | Status: DC

## 2018-08-12 MED ORDER — CEFTRIAXONE SODIUM 250 MG IJ SOLR
250.0000 mg | Freq: Once | INTRAMUSCULAR | Status: AC
  Administered 2018-08-12: 250 mg via INTRAMUSCULAR
  Filled 2018-08-12: qty 250

## 2018-08-12 MED ORDER — DOXYCYCLINE HYCLATE 100 MG PO CAPS: 100.0000 mg | ORAL_CAPSULE | Freq: Two times a day (BID) | ORAL | 0 refills | Status: AC

## 2018-08-12 MED ORDER — PHENAZOPYRIDINE HCL 100 MG PO TABS
200.0000 mg | ORAL_TABLET | Freq: Once | ORAL | Status: AC
  Administered 2018-08-12: 200 mg via ORAL
  Filled 2018-08-12: qty 2

## 2018-08-12 MED ORDER — DOXYCYCLINE HYCLATE 100 MG PO TABS
100.0000 mg | ORAL_TABLET | Freq: Once | ORAL | Status: AC
  Administered 2018-08-12: 100 mg via ORAL
  Filled 2018-08-12: qty 1

## 2018-08-12 MED ORDER — LIDOCAINE HCL (PF) 1 % IJ SOLN
INTRAMUSCULAR | Status: AC
  Filled 2018-08-12: qty 5

## 2018-08-12 NOTE — Discharge Instructions (Addendum)
1. Medications: Please take all of your antibiotics until finished!   You may develop abdominal discomfort or diarrhea from the antibiotic.  You may help offset this with probiotics which you can buy or get in yogurt. Do not eat  or take the probiotics until 2 hours after your antibiotic.  Take Zofran as needed for nausea.  Wait around 20 minutes before eating or drinking after taking this medication.  You can also take Zantac twice daily as needed for reflux. 2. Treatment: Eat a diet of bland foods that will not upset your stomach.  Avoid fried foods, spicy foods, fatty foods, or alcohol. 3. Follow Up: Please followup with your primary doctor in 3-5 days for discussion of your diagnoses and further evaluation after today's visit; if you do not have a primary care doctor use the resource guide provided to find one; Please return to the ER for persistent vomiting, high fevers or worsening symptoms  Some of your lab work is still pending and will not return for the next 24 to 48 hours.  You will receive a phone call if any of your lab work is abnormal.
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
## 2018-08-13 LAB — URINE CULTURE

## 2018-08-13 LAB — GC/CHLAMYDIA PROBE AMP (~~LOC~~) NOT AT ARMC
Chlamydia: NEGATIVE
Neisseria Gonorrhea: NEGATIVE

## 2018-08-13 LAB — RPR, QUANT+TP ABS (REFLEX)
Rapid Plasma Reagin, Quant: 1:1 {titer} — ABNORMAL HIGH
T Pallidum Abs: NEGATIVE

## 2018-08-13 LAB — RPR: RPR Ser Ql: REACTIVE — AB

## 2018-08-13 LAB — HIV ANTIBODY (ROUTINE TESTING W REFLEX): HIV Screen 4th Generation wRfx: NONREACTIVE

## 2018-11-10 ENCOUNTER — Other Ambulatory Visit: Payer: Self-pay

## 2018-11-10 ENCOUNTER — Encounter (HOSPITAL_COMMUNITY): Payer: Self-pay

## 2018-11-10 ENCOUNTER — Emergency Department (HOSPITAL_COMMUNITY)
Admission: EM | Admit: 2018-11-10 | Discharge: 2018-11-10 | Disposition: A | Discharge status: Home / Self Care | Payer: Managed Care, Other (non HMO) | Attending: Emergency Medicine | Admitting: Emergency Medicine

## 2018-11-10 DIAGNOSIS — Z79899 Other long term (current) drug therapy: Secondary | ICD-10-CM | POA: Insufficient documentation

## 2018-11-10 DIAGNOSIS — Z87891 Personal history of nicotine dependence: Secondary | ICD-10-CM | POA: Insufficient documentation

## 2018-11-10 DIAGNOSIS — E860 Dehydration: Secondary | ICD-10-CM | POA: Insufficient documentation

## 2018-11-10 DIAGNOSIS — F1910 Other psychoactive substance abuse, uncomplicated: Secondary | ICD-10-CM

## 2018-11-10 DIAGNOSIS — N3000 Acute cystitis without hematuria: Secondary | ICD-10-CM | POA: Insufficient documentation

## 2018-11-10 LAB — URINALYSIS, ROUTINE W REFLEX MICROSCOPIC
Bilirubin Urine: NEGATIVE
Glucose, UA: NEGATIVE mg/dL
Ketones, ur: 5 mg/dL — AB
Nitrite: POSITIVE — AB
Protein, ur: NEGATIVE mg/dL
Specific Gravity, Urine: 1.013 (ref 1.005–1.030)
pH: 5 (ref 5.0–8.0)

## 2018-11-10 LAB — COMPREHENSIVE METABOLIC PANEL
ALT: 26 U/L (ref 0–44)
ANION GAP: 9 (ref 5–15)
AST: 22 U/L (ref 15–41)
Albumin: 4.5 g/dL (ref 3.5–5.0)
Alkaline Phosphatase: 61 U/L (ref 38–126)
BUN: 14 mg/dL (ref 6–20)
CO2: 20 mmol/L — ABNORMAL LOW (ref 22–32)
Calcium: 9.1 mg/dL (ref 8.9–10.3)
Chloride: 109 mmol/L (ref 98–111)
Creatinine, Ser: 0.67 mg/dL (ref 0.44–1.00)
GFR calc Af Amer: >60 mL/min (ref >60)
GFR calc non Af Amer: >60 mL/min (ref >60)
GLUCOSE: 104 mg/dL — AB (ref 70–99)
Potassium: 3.8 mmol/L (ref 3.5–5.1)
Sodium: 138 mmol/L (ref 135–145)
Total Bilirubin: 0.5 mg/dL (ref 0.3–1.2)
Total Protein: 8.2 g/dL — ABNORMAL HIGH (ref 6.5–8.1)

## 2018-11-10 LAB — CBC
HEMATOCRIT: 40.9 % (ref 36.0–46.0)
Hemoglobin: 13.6 g/dL (ref 12.0–15.0)
MCH: 33.4 pg (ref 26.0–34.0)
MCHC: 33.3 g/dL (ref 30.0–36.0)
MCV: 100.5 fL — ABNORMAL HIGH (ref 80.0–100.0)
Platelets: 248 10*3/uL (ref 150–400)
RBC: 4.07 MIL/uL (ref 3.87–5.11)
RDW: 12.5 % (ref 11.5–15.5)
WBC: 19.4 10*3/uL — AB (ref 4.0–10.5)
nRBC: 0 % (ref 0.0–0.2)

## 2018-11-10 LAB — INFLUENZA PANEL BY PCR (TYPE A & B)
Influenza A By PCR: NEGATIVE
Influenza B By PCR: NEGATIVE

## 2018-11-10 LAB — RAPID URINE DRUG SCREEN, HOSP PERFORMED
Amphetamines: NOT DETECTED
Barbiturates: NOT DETECTED
Benzodiazepines: NOT DETECTED
Cocaine: POSITIVE — AB
Opiates: NOT DETECTED
Tetrahydrocannabinol: POSITIVE — AB

## 2018-11-10 LAB — I-STAT BETA HCG BLOOD, ED (MC, WL, AP ONLY): I-stat hCG, quantitative: <5 m[IU]/mL (ref <5)

## 2018-11-10 LAB — ETHANOL: Alcohol, Ethyl (B): <10 mg/dL (ref <10)

## 2018-11-10 MED ORDER — SODIUM CHLORIDE 0.9 % IV SOLN
1.0000 g | Freq: Once | INTRAVENOUS | Status: AC
  Administered 2018-11-10: 1 g via INTRAVENOUS
  Filled 2018-11-10: qty 10

## 2018-11-10 MED ORDER — IBUPROFEN 200 MG PO TABS
600.0000 mg | ORAL_TABLET | Freq: Once | ORAL | Status: AC
  Administered 2018-11-10: 600 mg via ORAL
  Filled 2018-11-10: qty 3

## 2018-11-10 MED ORDER — CEPHALEXIN 500 MG PO CAPS: 500.0000 mg | ORAL_CAPSULE | Freq: Three times a day (TID) | ORAL | 0 refills | Status: DC

## 2018-11-10 MED ORDER — SODIUM CHLORIDE 0.9 % IV BOLUS
1000.0000 mL | Freq: Once | INTRAVENOUS | Status: AC
  Administered 2018-11-10: 1000 mL via INTRAVENOUS

## 2018-11-10 NOTE — ED Notes (Signed)
Bed: WA04 Expected date:  Expected time:  Means of arrival:  Comments: Med Clearance

## 2018-11-10 NOTE — ED Triage Notes (Addendum)
Pt BIBA from [facility]. Pt states that she used cocaine at 0400. Pt's HR was 130s after receiving 300 cc of NS. Pt has been off neurotin for a month.  Pt states she wants to talk to a shrink, but does not want medicine.  Pt is screaming that she needs ice and has borderline personality disorder, constantly cursing in triage. Pt also requesting STD test.

## 2018-11-12 LAB — URINE CULTURE

## 2018-11-13 ENCOUNTER — Telehealth: Payer: Self-pay | Admitting: Emergency Medicine

## 2018-11-13 NOTE — Telephone Encounter (Signed)
Post ED Visit - Positive Culture Follow-up  Culture report reviewed by antimicrobial stewardship pharmacist:  []  Enzo Bi, Pharm.D. []  Celedonio Miyamoto, Pharm.D., BCPS AQ-ID []  Garvin Fila, Pharm.D., BCPS []  Georgina Pillion, Pharm.D., BCPS []  Minh Pham, [address]D., BCPS, AAHIVP []  [address], Pharm.D., BCPS, AAHIVP []  Estella Husk, PharmD, BCPS []  Lysle Pearl, PharmD, BCPS []  Phillips Climes, PharmD, BCPS [x]  Agapito Games, PharmD  Positive Urine culture Treated with Cephalexin, organism sensitive to the same and no further patient follow-up is required at this time.  Gloria Heath 11/13/2018, 3:27 PM

## 2018-11-13 NOTE — ED Provider Notes (Signed)
[facility]-EMERGENCY DEPT Provider Note   CSN: [id number] Arrival date & time: 11/10/18  0725     History   Chief Complaint Chief Complaint  Patient presents with  . Anxiety  . Drug Problem    HPI Gloria Heath is a 33 y.o. female.  HPI Patient is a 33 year old female presents emergency department with complaints of dysuria and urinary frequency and found to have a fever of 101.1 on arrival to emergency department.  She also reports she is been up using cocaine through most of the night.  She was brought to the emergency department via EMS.  She was given a 300 cc of normal saline prior to my evaluation.  She states she is been off her medication including her Neurontin.  She has had transient suicidal thoughts without suicidal plan.  She currently is feeling better from a depression and suicidal standpoint.  She reports new dysuria and urinary frequency over the past 24 hours without nausea or vomiting.  Denies flank pain.  Denies abdominal pain.  According to nursing staff the patient was somewhat belligerent and screaming and yelling at staff however on my evaluation the patient is calm and cooperative.  She is surprisingly well spoken.  She does not seem agitated at this time.  She was hoping to speak with a psychiatrist here in the emergency department.  She does not feel like she is a threat to herself or others at this time.  She does not feel as though she needs to be acutely hospitalized from a psychiatric standpoint.   Past Medical History:  Diagnosis Date  . Anxiety   . Depression   . HPV (human papilloma virus) infection 2006   cleared on it's own, no treatment.     Patient Active Problem List   Diagnosis Date Noted  . Posttraumatic stress disorder   . Borderline personality disorder (HCC)   . 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 No pertinent surgical history.   OB History    Gravida    2   Para  1   Term  1   Preterm      AB  0   Living  1     SAB      TAB  0   Ectopic      Multiple      Live Births               Home Medications    Prior to Admission medications   Medication Sig Start Date End Date Taking? Authorizing Provider  acetaminophen (TYLENOL) 500 MG tablet Take 1,000 mg by mouth every 6 (six) hours as needed for mild pain.   Yes [provider]  amphetamine-dextroamphetamine (ADDERALL) 20 MG tablet Take 20 mg by mouth 3 (three) times daily. 07/16/18  Yes [provider]  DM-Doxylamine-Acetaminophen (NYQUIL COLD & FLU PO) Take 30 mLs by mouth at bedtime as needed (cough, headache, fever, achy, congestion).   Yes [provider]  cephALEXin (KEFLEX) 500 MG capsule Take 1 capsule (500 mg total) by mouth 3 (three) times daily. 11/10/18   Azalia Bilisampos, Kaysea Raya, MD  gabapentin (NEURONTIN) 600 MG tablet Take 1 tablet (600 mg total) by mouth 3 (three) times daily. For mood control Patient not taking: Reported on 11/10/2018 07/02/18   Money, Gerlene Burdockravis B, FNP  hydrOXYzine (ATARAX/VISTARIL) 25 MG tablet Take 1 tablet (25 mg total) by mouth 3 (three) times daily as needed for  anxiety. Patient not taking: Reported on 07/28/2018 07/02/18   Money, Gerlene Burdock, FNP  ondansetron (ZOFRAN) 4 MG tablet Take 1 tablet (4 mg total) by mouth every 8 (eight) hours as needed for nausea or vomiting. Patient not taking: Reported on 11/10/2018 08/12/18   Michela Pitcher A, PA-C  ranitidine (ZANTAC) 150 MG tablet Take 1 tablet (150 mg total) by mouth 2 (two) times daily. Patient not taking: Reported on 11/10/2018 08/12/18   Michela Pitcher A, PA-C  silver sulfADIAZINE (SILVADENE) 1 % cream Apply topically 2 (two) times daily. Patient not taking: Reported on 07/28/2018 07/02/18   Money, Gerlene Burdock, FNP  traZODone (DESYREL) 50 MG tablet Take 1 tablet (50 mg total) by mouth at bedtime as needed for sleep. Patient not taking: Reported on 07/28/2018 07/02/18   Money, Gerlene Burdock, FNP     Family History Family History  Problem Relation Age of Onset  . Polycystic ovary syndrome Sister   . Polycystic ovary syndrome Maternal Grandmother   . Cancer Maternal Grandmother        lung  . Cancer Paternal Grandfather        lung    Social History Social History   Tobacco Use  . Smoking status: Former Smoker    Packs/day: 0.00    Types: Cigarettes  . Smokeless tobacco: Never Used  Substance Use Topics  . Alcohol use: Yes    Alcohol/week: 4.0 standard drinks    Types: 2 Glasses of wine, 2 Cans of beer per week    Comment: social  . Drug use: Yes    Types: Cocaine     Allergies   Patient has no known allergies.   Review of Systems Review of Systems  All other systems reviewed and are negative.    Physical Exam Updated Vital Signs BP 133/86 (BP Location: Right Arm)   Pulse 100   Temp 97.9 F (36.6 C) (Oral)   Resp 18   Ht 5\' 5"  (1.651 m)   Wt 86.2 kg   LMP 10/20/2018   SpO2 98%   BMI 31.62 kg/m   Physical Exam Vitals signs and nursing note reviewed.  Constitutional:      General: She is not in acute distress.    Appearance: She is well-developed.  HENT:     Head: Normocephalic and atraumatic.  Neck:     Musculoskeletal: Normal range of motion.  Cardiovascular:     Rate and Rhythm: Regular rhythm. Tachycardia present.     Heart sounds: Normal heart sounds.  Pulmonary:     Effort: Pulmonary effort is normal.     Breath sounds: Normal breath sounds.  Abdominal:     General: There is no distension.     Palpations: Abdomen is soft.     Tenderness: There is no abdominal tenderness.  Musculoskeletal: Normal range of motion.  Skin:    General: Skin is warm and dry.  Neurological:     Mental Status: She is alert and oriented to person, place, and time.  Psychiatric:        Judgment: Judgment normal.      ED Treatments / Results  Labs (all labs ordered are listed, but only abnormal results are displayed) Labs Reviewed  URINE CULTURE  - Abnormal; Notable for the following components:      Result Value   Culture >=100,000 COLONIES/mL ESCHERICHIA COLI (*)    Organism ID, Bacteria ESCHERICHIA COLI (*)    All other components within normal limits  CBC - Abnormal; Notable  for the following components:   WBC 19.4 (*)    MCV 100.5 (*)    All other components within normal limits  COMPREHENSIVE METABOLIC PANEL - Abnormal; Notable for the following components:   CO2 20 (*)    Glucose, Bld 104 (*)    Total Protein 8.2 (*)    All other components within normal limits  RAPID URINE DRUG SCREEN, HOSP PERFORMED - Abnormal; Notable for the following components:   Cocaine POSITIVE (*)    Tetrahydrocannabinol POSITIVE (*)    All other components within normal limits  URINALYSIS, ROUTINE W REFLEX MICROSCOPIC - Abnormal; Notable for the following components:   APPearance HAZY (*)    Hgb urine dipstick MODERATE (*)    Ketones, ur 5 (*)    Nitrite POSITIVE (*)    Leukocytes, UA SMALL (*)    Bacteria, UA RARE (*)    All other components within normal limits  ETHANOL  INFLUENZA PANEL BY PCR (TYPE A & B)  I-STAT BETA HCG BLOOD, ED ([facility], [facility], [facility] ONLY)    EKG None  Radiology No results found.  Procedures Procedures (including critical care time)  Medications Ordered in ED Medications  sodium chloride 0.9 % bolus 1,000 mL (0 mLs Intravenous Stopped 11/10/18 1320)  ibuprofen (ADVIL,MOTRIN) tablet 600 mg (600 mg Oral Given 11/10/18 0911)  cefTRIAXone (ROCEPHIN) 1 g in sodium chloride 0.9 % 100 mL IVPB (0 g Intravenous Stopped 11/10/18 1320)     Initial Impression / Assessment and Plan / ED Course  I have reviewed the triage vital signs and the nursing notes.  Pertinent labs & imaging results that were available during my care of the patient were reviewed by me and considered in my medical decision making (see chart for details).     Patient does appear to have a urinary tract infection.  IV Rocephin given in the emergency  department.  Patient be discharged home with Keflex.  Patient is influenza negative.  I had a long discussion with the patient regarding options for mental health evaluation.  I do not believe the patient is to be acutely hospitalized from a psychiatric standpoint this time.  She is given the information for [facility] for close psychiatric follow-up.  She understands return the emergency department for new or worsening symptoms  Final Clinical Impressions(s) / ED Diagnoses   Final diagnoses:  Acute dehydration  Acute cystitis without hematuria  Substance abuse [facility])    ED Discharge Orders         Ordered    cephALEXin (KEFLEX) 500 MG capsule  3 times daily     11/10/18 1325           Azalia Bilis, MD 11/13/18 1314

## 2018-11-16 LAB — URINALYSIS, MICROSCOPIC (REFLEX)

## 2020-03-15 ENCOUNTER — Other Ambulatory Visit: Payer: Self-pay

## 2020-03-15 ENCOUNTER — Emergency Department (HOSPITAL_COMMUNITY)
Admission: EM | Admit: 2020-03-15 | Discharge: 2020-03-18 | Disposition: A | Discharge status: Home / Self Care | Payer: HRSA Program | Attending: Emergency Medicine | Admitting: Emergency Medicine

## 2020-03-15 DIAGNOSIS — Z79899 Other long term (current) drug therapy: Secondary | ICD-10-CM | POA: Insufficient documentation

## 2020-03-15 DIAGNOSIS — F4310 Post-traumatic stress disorder, unspecified: Secondary | ICD-10-CM | POA: Diagnosis not present

## 2020-03-15 DIAGNOSIS — R52 Pain, unspecified: Secondary | ICD-10-CM

## 2020-03-15 DIAGNOSIS — Z87891 Personal history of nicotine dependence: Secondary | ICD-10-CM | POA: Insufficient documentation

## 2020-03-15 DIAGNOSIS — F15150 Other stimulant abuse with stimulant-induced psychotic disorder with delusions: Secondary | ICD-10-CM | POA: Insufficient documentation

## 2020-03-15 DIAGNOSIS — F15959 Other stimulant use, unspecified with stimulant-induced psychotic disorder, unspecified: Secondary | ICD-10-CM | POA: Diagnosis present

## 2020-03-15 DIAGNOSIS — R4182 Altered mental status, unspecified: Secondary | ICD-10-CM | POA: Diagnosis present

## 2020-03-15 DIAGNOSIS — F603 Borderline personality disorder: Secondary | ICD-10-CM | POA: Insufficient documentation

## 2020-03-15 DIAGNOSIS — U071 COVID-19: Secondary | ICD-10-CM | POA: Diagnosis not present

## 2020-03-15 DIAGNOSIS — F23 Brief psychotic disorder: Secondary | ICD-10-CM

## 2020-03-15 DIAGNOSIS — F15950 Other stimulant use, unspecified with stimulant-induced psychotic disorder with delusions: Secondary | ICD-10-CM

## 2020-03-15 DIAGNOSIS — F29 Unspecified psychosis not due to a substance or known physiological condition: Secondary | ICD-10-CM | POA: Diagnosis present
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
## 2020-03-16 ENCOUNTER — Emergency Department (HOSPITAL_COMMUNITY): Payer: HRSA Program

## 2020-03-16 DIAGNOSIS — F29 Unspecified psychosis not due to a substance or known physiological condition: Secondary | ICD-10-CM | POA: Diagnosis present

## 2020-03-16 LAB — BASIC METABOLIC PANEL
Anion gap: 10 (ref 5–15)
BUN: 13 mg/dL (ref 6–20)
CO2: 22 mmol/L (ref 22–32)
Calcium: 8.6 mg/dL — ABNORMAL LOW (ref 8.9–10.3)
Chloride: 104 mmol/L (ref 98–111)
Creatinine, Ser: 0.6 mg/dL (ref 0.44–1.00)
GFR calc Af Amer: >60 mL/min (ref >60)
GFR calc non Af Amer: >60 mL/min (ref >60)
Glucose, Bld: 112 mg/dL — ABNORMAL HIGH (ref 70–99)
Potassium: 3.5 mmol/L (ref 3.5–5.1)
Sodium: 136 mmol/L (ref 135–145)

## 2020-03-16 LAB — RAPID URINE DRUG SCREEN, HOSP PERFORMED
Amphetamines: POSITIVE — AB
Barbiturates: NOT DETECTED
Benzodiazepines: NOT DETECTED
Cocaine: NOT DETECTED
Opiates: NOT DETECTED
Tetrahydrocannabinol: POSITIVE — AB

## 2020-03-16 LAB — SALICYLATE LEVEL: Salicylate Lvl: <7 mg/dL — ABNORMAL LOW (ref 7.0–30.0)

## 2020-03-16 LAB — CBC WITH DIFFERENTIAL/PLATELET
Abs Immature Granulocytes: 0.04 10*3/uL (ref 0.00–0.07)
Basophils Absolute: 0.1 10*3/uL (ref 0.0–0.1)
Basophils Relative: 0 %
Eosinophils Absolute: 0 10*3/uL (ref 0.0–0.5)
Eosinophils Relative: 0 %
HCT: 38.3 % (ref 36.0–46.0)
Hemoglobin: 12.9 g/dL (ref 12.0–15.0)
Immature Granulocytes: 0 %
Lymphocytes Relative: 10 %
Lymphs Abs: 1.6 10*3/uL (ref 0.7–4.0)
MCH: 33.5 pg (ref 26.0–34.0)
MCHC: 33.7 g/dL (ref 30.0–36.0)
MCV: 99.5 fL (ref 80.0–100.0)
Monocytes Absolute: 1.2 10*3/uL — ABNORMAL HIGH (ref 0.1–1.0)
Monocytes Relative: 8 %
Neutro Abs: 12.8 10*3/uL — ABNORMAL HIGH (ref 1.7–7.7)
Neutrophils Relative %: 82 %
Platelets: 256 10*3/uL (ref 150–400)
RBC: 3.85 MIL/uL — ABNORMAL LOW (ref 3.87–5.11)
RDW: 12.6 % (ref 11.5–15.5)
WBC: 15.6 10*3/uL — ABNORMAL HIGH (ref 4.0–10.5)
nRBC: 0 % (ref 0.0–0.2)

## 2020-03-16 LAB — URINALYSIS, ROUTINE W REFLEX MICROSCOPIC
Bilirubin Urine: NEGATIVE
Glucose, UA: NEGATIVE mg/dL
Hgb urine dipstick: NEGATIVE
Ketones, ur: 5 mg/dL — AB
Nitrite: NEGATIVE
Protein, ur: NEGATIVE mg/dL
Specific Gravity, Urine: 1.004 — ABNORMAL LOW (ref 1.005–1.030)
pH: 6 (ref 5.0–8.0)

## 2020-03-16 LAB — ETHANOL: Alcohol, Ethyl (B): <10 mg/dL (ref <10)

## 2020-03-16 LAB — PREGNANCY, URINE: Preg Test, Ur: NEGATIVE

## 2020-03-16 LAB — SARS CORONAVIRUS 2 BY RT PCR (HOSPITAL ORDER, PERFORMED IN ~~LOC~~ HOSPITAL LAB): SARS Coronavirus 2: POSITIVE — AB

## 2020-03-16 LAB — ACETAMINOPHEN LEVEL: Acetaminophen (Tylenol), Serum: <10 ug/mL — ABNORMAL LOW (ref 10–30)

## 2020-03-16 MED ORDER — ACETAMINOPHEN 325 MG PO TABS
650.0000 mg | ORAL_TABLET | Freq: Once | ORAL | Status: AC
  Administered 2020-03-16: 650 mg via ORAL
  Filled 2020-03-16: qty 2

## 2020-03-16 MED ORDER — HALOPERIDOL 5 MG PO TABS: 5.0000 mg | ORAL_TABLET | Freq: Four times a day (QID) | ORAL | Status: DC | PRN

## 2020-03-16 MED ORDER — LORAZEPAM 1 MG PO TABS: 2.0000 mg | ORAL_TABLET | Freq: Four times a day (QID) | ORAL | Status: DC | PRN

## 2020-03-16 MED ORDER — DIPHENHYDRAMINE HCL 25 MG PO CAPS: 50.0000 mg | ORAL_CAPSULE | Freq: Four times a day (QID) | ORAL | Status: DC | PRN

## 2020-03-16 MED ORDER — DIPHENHYDRAMINE HCL 50 MG/ML IJ SOLN: 50.0000 mg | Freq: Four times a day (QID) | INTRAMUSCULAR | Status: DC | PRN

## 2020-03-16 MED ORDER — HALOPERIDOL LACTATE 5 MG/ML IJ SOLN: 10.0000 mg | Freq: Four times a day (QID) | INTRAMUSCULAR | Status: DC | PRN

## 2020-03-16 MED ORDER — QUETIAPINE FUMARATE 50 MG PO TABS
50.0000 mg | ORAL_TABLET | Freq: Two times a day (BID) | ORAL | Status: DC
  Administered 2020-03-17 – 2020-03-18 (×2): 50 mg via ORAL
  Filled 2020-03-16 (×4): qty 1

## 2020-03-16 MED ORDER — LORAZEPAM 2 MG/ML IJ SOLN: 2.0000 mg | Freq: Four times a day (QID) | INTRAMUSCULAR | Status: DC | PRN

## 2020-03-16 MED ORDER — QUETIAPINE FUMARATE 100 MG PO TABS
100.0000 mg | ORAL_TABLET | Freq: Every day | ORAL | Status: DC
  Administered 2020-03-17: 100 mg via ORAL
  Filled 2020-03-16 (×2): qty 1

## 2020-03-16 NOTE — ED Notes (Addendum)
Pt AOx4, calm and cooperative at this time, dressed out in purple scrubs. Pt c/o right leg pain, worse with standing.
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
## 2020-03-16 NOTE — ED Notes (Signed)
Pt states she will not take any medications offered to her here until she speaks with her personal psychiatrist. Pt states " I feel better without the medications, there is really no reason I need to be on them anyway. I don't even have the disorder that these medications treat. So I am not taking them and you cant make me."

## 2020-03-16 NOTE — ED Notes (Signed)
Removed IV on left AC.

## 2020-03-16 NOTE — ED Notes (Signed)
Pt is delusional believing that an orange light is going to [place] on a particular day in June.  And said something unintelligible about what is going to happen to the women and children.

## 2020-03-16 NOTE — ED Notes (Signed)
Pt said that she was not tested but that she has had Covid for about 2 weeks because 2 weeks ago she was around people who are positive and she was "very sick".  At the present time she is not having symptoms.

## 2020-03-16 NOTE — BH Assessment (Signed)
[facility] Assessment Progress Note  Per Reola Calkins, NP, this pt, who presents at [facility] voluntarily by police, requires psychiatric hospitalization at this time.  Her Covid-19 test, however, has resulted positive.  [employer] Marianna Fuss, MD  finds that pt meets criteria for IVC, which he has initiated.  IVC documents have been faxed to [facility], and at [employer] confirms receipt.  He has since faxed Findings and Custody Order to this Clinical research associate.  At 15:48 I called [employer] and spoke to [employer], who took demographic information, agreeing to dispatch law enforcement to fill out Return of Service.  As of this writing arrival of law enforcement is pending.  Pt's nurse, Diane, has been notified.  Doylene Canning, [place] [facility] Coordinator [phone]

## 2020-03-16 NOTE — ED Notes (Signed)
Date and time results received: 03/16/20 9:46 AM  (use smartphrase ".now" to insert current time)  Test: covid  Critical Value: positive  Name of Provider Notified: Dekina RN   Orders Received? Or Actions Taken?:
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
## 2020-03-16 NOTE — ED Notes (Signed)
Pt resting comfortably, no complaints at this time. Sitter at pt doorside
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
## 2020-03-16 NOTE — BH Assessment (Signed)
[facility] Assessment Progress Note   Clinician attempted to engage patient in assessment but she would only stare and not say anything.  TTS to attempt to see patient again.

## 2020-03-17 DIAGNOSIS — F15951 Other stimulant use, unspecified with stimulant-induced psychotic disorder with hallucinations: Secondary | ICD-10-CM

## 2020-03-17 MED ORDER — NICOTINE POLACRILEX 2 MG MT GUM
2.0000 mg | CHEWING_GUM | OROMUCOSAL | Status: DC | PRN
  Administered 2020-03-17: 2 mg via ORAL
  Filled 2020-03-17: qty 1

## 2020-03-17 MED ORDER — ACETAMINOPHEN 325 MG PO TABS
650.0000 mg | ORAL_TABLET | Freq: Once | ORAL | Status: AC
  Administered 2020-03-17: 650 mg via ORAL
  Filled 2020-03-17: qty 2

## 2020-03-17 NOTE — ED Notes (Signed)
Pt given Tylenol due to body aches.

## 2020-03-17 NOTE — ED Notes (Signed)
Pt alert x4 states that she is feeling better after taking her Seroquel. Has also agreed to take her medication this evening. Denies any SI or HI or any hallucinations at this time. States that she is aware of why she is here, and that she just felt like getting naked and dancing and running around in the rain. Has requested some adult coloring and word cross work sheets to help pass the time. The pt was given activity work sheets from the staff, she has shared with the Clinical research associate that she has dime size circular  scabs on her bil thumbs and on top of her pubic hair, she states that the circular dry scabs itches at times, made MD Zackowski aware no new orders at this time. I will continue to monitor.

## 2020-03-17 NOTE — ED Provider Notes (Signed)
Emergency Medicine Observation Re-evaluation Note  Gloria Heath is a 34 y.o. female, seen on rounds today.  Pt initially presented to the ED for complaints of Altered Mental Status Currently, the patient is awake and less confused  Physical Exam  BP 112/60 (BP Location: Right Arm)   Pulse 64   Temp 98.1 F (36.7 C) (Oral)   Resp 16   LMP  (LMP Unknown)   SpO2 99%  Physical Exam  ED Course / MDM  EKG:    I have reviewed the labs performed to date as well as medications administered while in observation.  Recent changes in the last 24 hours include improvement in psychosis Plan  Current plan is for psychiatric reevaluation. Patient is under full IVC at this time.   Margarita Grizzle, MD 03/17/20 1020

## 2020-03-17 NOTE — ED Notes (Signed)
Pt asleep and resting comfortably at this time.  

## 2020-03-17 NOTE — ED Notes (Signed)
Pt resting, asleep, comfortable, no s/s of distress.  Sitter at bedside.

## 2020-03-17 NOTE — ED Notes (Signed)
Pt alert. Pt cooperative with care. Pt showered. Sitter at bedside.

## 2020-03-18 DIAGNOSIS — F15959 Other stimulant use, unspecified with stimulant-induced psychotic disorder, unspecified: Secondary | ICD-10-CM | POA: Diagnosis present

## 2020-03-18 NOTE — Discharge Instructions (Signed)
[facility] [address] [place] [address], [place]  [address] [phone]

## 2020-03-18 NOTE — Progress Notes (Signed)
Consult request has been received. CSW attempting to follow up at present time.  CSW sent pt's RN resources via email for substance use disorder tx (inpatient and outpatient), counseling, and for PCP resource needs.  Pt's RN agrees to print out resources and provide them to the pt w/explanation as pt as COVID positive.  CSW will continue to follow for D/C needs.  Dorothe Pea. Taim Wurm  MSW, LCSW, LCAS, CSI Transitions of Care Clinical Social Worker Care Coordination Department Ph: [phone]
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
## 2020-03-18 NOTE — ED Notes (Signed)
Pt DCd off unit to home per provider. Pt alert, calm, cooperative, no s/s of distress. DC information given to pt, pt acknowledged understanding. Pt ambulatory off unit, escorted off unit by RN. Pt transported by taxi, pt given taxi voucher.
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
## 2020-04-13 ENCOUNTER — Ambulatory Visit (HOSPITAL_COMMUNITY)
Admission: EM | Admit: 2020-04-13 | Discharge: 2020-04-13 | Disposition: A | Discharge status: Home / Self Care | Payer: Self-pay | Attending: Family Medicine | Admitting: Family Medicine

## 2020-04-13 ENCOUNTER — Encounter (HOSPITAL_COMMUNITY): Payer: Self-pay

## 2020-04-13 DIAGNOSIS — Z7251 High risk heterosexual behavior: Secondary | ICD-10-CM | POA: Insufficient documentation

## 2020-04-13 DIAGNOSIS — L739 Follicular disorder, unspecified: Secondary | ICD-10-CM | POA: Insufficient documentation

## 2020-04-13 DIAGNOSIS — Z113 Encounter for screening for infections with a predominantly sexual mode of transmission: Secondary | ICD-10-CM | POA: Insufficient documentation

## 2020-04-13 LAB — HIV ANTIBODY (ROUTINE TESTING W REFLEX): HIV Screen 4th Generation wRfx: NONREACTIVE

## 2020-04-13 MED ORDER — DOXYCYCLINE HYCLATE 100 MG PO CAPS
100.0000 mg | ORAL_CAPSULE | Freq: Two times a day (BID) | ORAL | 0 refills | Status: DC
Start: 2020-04-13 — End: 2021-05-23

## 2020-04-13 NOTE — Discharge Instructions (Signed)
Cleanse sores daily with soap and water, avoid touching or squeezing.  Change razor blade regularly.  Complete course of antibiotics.   We will notify of you any positive findings or if any changes to treatment are needed. If normal or otherwise without concern to your results, we will not call you. Please log on to your MyChart to review your results if interested in so.

## 2020-04-13 NOTE — ED Triage Notes (Signed)
Pt presents to UC for suspected herpes. Pt states she has open sores on vagina, perineum, and butt crack. Pt states sores are painful and spreading. Pt denies contact with partner with herpes. Pt states sores have been present x1 month. Pt denies OTC treatment or relieving factors.

## 2020-04-13 NOTE — ED Provider Notes (Signed)
[facility]    CSN: [id number] Arrival date & time: 04/13/20  1703      History   Chief Complaint Chief Complaint  Patient presents with   SEXUALLY TRANSMITTED DISEASE    HPI Gloria Heath is a 34 y.o. female.   Gloria Heath presents with complaints of "sores" to mons pubis, buttocks and legs. For at least 1 month. She is concerned about herpes. Sexually active with 4 partners, doesn't use condoms regularly. Painful and have drainage. Denies any vaginal discharge or itching. States she has had similar in the past but has not sought treatment. Per chart review was treated for similar in November of 2019. She denies any illicit drug use outside of marijuana.   ROS per HPI, negative if not otherwise mentioned.      Past Medical History:  Diagnosis Date   Anxiety    Depression    HPV (human papilloma virus) infection 2006   cleared on it's own, no treatment.     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 No pertinent surgical history.  OB History    Gravida  2   Para  1   Term  1   Preterm      AB  0   Living  1     SAB      TAB  0   Ectopic      Multiple      Live Births               Home Medications    Prior to Admission medications   Medication Sig Start Date End Date Taking? Authorizing Provider  QUEtiapine (SEROQUEL) 50 MG tablet Take 50 mg by mouth at bedtime as needed (sleep aid).   Yes [provider]  doxycycline (VIBRAMYCIN) 100 MG capsule Take 1 capsule (100 mg total) by mouth 2 (two) times daily. 04/13/20   Georgetta Haber, NP  hydrOXYzine (ATARAX/VISTARIL) 25 MG tablet Take 1 tablet (25 mg total) by mouth 3 (three) times daily as needed for anxiety. Patient not taking: Reported on 07/28/2018 07/02/18   Money, Gerlene Burdock, FNP  traZODone (DESYREL) 50  MG tablet Take 1 tablet (50 mg total) by mouth at bedtime as needed for sleep. Patient not taking: Reported on 07/28/2018 07/02/18   Money, Gerlene Burdock, FNP    Family History Family History  Problem Relation Age of Onset   Polycystic ovary syndrome Sister    Polycystic ovary syndrome Maternal Grandmother    Cancer Maternal Grandmother        lung   Cancer Paternal Grandfather        lung    Social History Social History   Tobacco Use   Smoking status: Former Smoker    Packs/day: 0.00    Types: Cigarettes   Smokeless tobacco: Never Used  Substance Use Topics   Alcohol use: Yes    Alcohol/week: 4.0 standard drinks    Types: 2 Glasses of wine, 2 Cans of beer per week    Comment: social   Drug use: Yes    Types: Cocaine     Allergies   Patient has no known allergies.   Review of Systems Review of Systems   Physical Exam Triage Vital Signs ED Triage Vitals [04/13/20 1758]  Enc Vitals Group     BP 118/62  Pulse Rate 89     Resp 16     Temp 98.2 F (36.8 C)     Temp Source Oral     SpO2 100 %     Weight      Height      Head Circumference      Peak Flow      Pain Score 0     Pain Loc      Pain Edu?      Excl. in GC?    No data found.  Updated Vital Signs BP 118/62 (BP Location: Right Arm)    Pulse 89    Temp 98.2 F (36.8 C) (Oral)    Resp 16    LMP 03/20/2020 (Approximate)    SpO2 100%   Visual Acuity Right Eye Distance:   Left Eye Distance:   Bilateral Distance:    Right Eye Near:   Left Eye Near:    Bilateral Near:     Physical Exam Exam conducted with a chaperone present.  Constitutional:      General: She is not in acute distress.    Appearance: She is well-developed.  Cardiovascular:     Rate and Rhythm: Normal rate.  Pulmonary:     Effort: Pulmonary effort is normal.  Skin:    General: Skin is warm and dry.     Comments: Scattered small abscess and folliculitis to mons pubis as well as left buttock, and right thigh; no  fluctuance or papule present- scabs present with mild surrounding redness  Neurological:     Mental Status: She is alert and oriented to person, place, and time.      UC Treatments / Results  Labs (all labs ordered are listed, but only abnormal results are displayed) Labs Reviewed  RPR  HIV ANTIBODY (ROUTINE TESTING W REFLEX)  CERVICOVAGINAL ANCILLARY ONLY    EKG   Radiology No results found.  Procedures Procedures (including critical care time)  Medications Ordered in UC Medications - No data to display  Initial Impression / Assessment and Plan / UC Course  I have reviewed the triage vital signs and the nursing notes.  Pertinent labs & imaging results that were available during my care of the patient were reviewed by me and considered in my medical decision making (see chart for details).     No indication of herpes lesions, folliculitis- patient endorses shaving pubic hair. Vaginal cytology as well as hiv/rpr screen collected per patient request. Will notify of any positive findings. Patient verbalized understanding and agreeable to plan. Safe sex encouraged.    Final Clinical Impressions(s) / UC Diagnoses   Final diagnoses:  Folliculitis  Screen for STD (sexually transmitted disease)  High risk sexual behavior, unspecified type     Discharge Instructions     Cleanse sores daily with soap and water, avoid touching or squeezing.  Change razor blade regularly.  Complete course of antibiotics.   We will notify of you any positive findings or if any changes to treatment are needed. If normal or otherwise without concern to your results, we will not call you. Please log on to your MyChart to review your results if interested in so.     ED Prescriptions    Medication Sig Dispense Auth. Provider   doxycycline (VIBRAMYCIN) 100 MG capsule Take 1 capsule (100 mg total) by mouth 2 (two) times daily. 20 capsule Georgetta Haber, NP     PDMP not reviewed this  encounter.   Georgetta Haber,  NP 04/13/20 1845

## 2020-04-14 LAB — RPR: RPR Ser Ql: NONREACTIVE

## 2020-04-16 LAB — CERVICOVAGINAL ANCILLARY ONLY
Bacterial Vaginitis (gardnerella): NEGATIVE
Candida Glabrata: NEGATIVE
Candida Vaginitis: NEGATIVE
Chlamydia: NEGATIVE
Comment: NEGATIVE
Comment: NEGATIVE
Comment: NEGATIVE
Comment: NEGATIVE
Comment: NEGATIVE
Comment: NORMAL
Neisseria Gonorrhea: NEGATIVE
Trichomonas: POSITIVE — AB

## 2020-04-18 ENCOUNTER — Telehealth (HOSPITAL_COMMUNITY): Payer: Self-pay | Admitting: Emergency Medicine

## 2020-04-18 MED ORDER — METRONIDAZOLE 500 MG PO TABS: 500.0000 mg | ORAL_TABLET | Freq: Two times a day (BID) | ORAL | 0 refills | Status: DC

## 2020-04-18 NOTE — Telephone Encounter (Signed)
Attempted to call patient to review lab results.  No answer, and voicemail is not set up.  Sent Flagyl to pharmacy on file, will continue to reach out.

## 2020-06-04 MED FILL — Quetiapine Fumarate Tab 50 MG: ORAL | Qty: 50 | Status: AC

## 2021-05-23 ENCOUNTER — Emergency Department (HOSPITAL_COMMUNITY)
Admission: EM | Admit: 2021-05-23 | Discharge: 2021-05-23 | Disposition: A | Discharge status: Home / Self Care | Payer: Self-pay | Attending: Emergency Medicine | Admitting: Emergency Medicine

## 2021-05-23 ENCOUNTER — Other Ambulatory Visit: Payer: Self-pay

## 2021-05-23 ENCOUNTER — Encounter (HOSPITAL_COMMUNITY): Payer: Self-pay

## 2021-05-23 ENCOUNTER — Emergency Department (HOSPITAL_COMMUNITY): Payer: Self-pay

## 2021-05-23 DIAGNOSIS — Z87891 Personal history of nicotine dependence: Secondary | ICD-10-CM | POA: Insufficient documentation

## 2021-05-23 DIAGNOSIS — Z20822 Contact with and (suspected) exposure to covid-19: Secondary | ICD-10-CM | POA: Insufficient documentation

## 2021-05-23 DIAGNOSIS — Z28310 Unvaccinated for covid-19: Secondary | ICD-10-CM | POA: Insufficient documentation

## 2021-05-23 DIAGNOSIS — D72829 Elevated white blood cell count, unspecified: Secondary | ICD-10-CM | POA: Insufficient documentation

## 2021-05-23 DIAGNOSIS — R1110 Vomiting, unspecified: Secondary | ICD-10-CM | POA: Insufficient documentation

## 2021-05-23 DIAGNOSIS — R059 Cough, unspecified: Secondary | ICD-10-CM | POA: Insufficient documentation

## 2021-05-23 DIAGNOSIS — R1084 Generalized abdominal pain: Secondary | ICD-10-CM | POA: Insufficient documentation

## 2021-05-23 LAB — COMPREHENSIVE METABOLIC PANEL
ALT: 20 U/L (ref 0–44)
AST: 21 U/L (ref 15–41)
Albumin: 4.5 g/dL (ref 3.5–5.0)
Alkaline Phosphatase: 72 U/L (ref 38–126)
Anion gap: 9 (ref 5–15)
BUN: 13 mg/dL (ref 6–20)
CO2: 29 mmol/L (ref 22–32)
Calcium: 9.5 mg/dL (ref 8.9–10.3)
Chloride: 105 mmol/L (ref 98–111)
Creatinine, Ser: 0.56 mg/dL (ref 0.44–1.00)
GFR, Estimated: >60 mL/min (ref >60)
Glucose, Bld: 92 mg/dL (ref 70–99)
Potassium: 3.8 mmol/L (ref 3.5–5.1)
Sodium: 143 mmol/L (ref 135–145)
Total Bilirubin: 0.7 mg/dL (ref 0.3–1.2)
Total Protein: 7.9 g/dL (ref 6.5–8.1)

## 2021-05-23 LAB — URINALYSIS, ROUTINE W REFLEX MICROSCOPIC
Bilirubin Urine: NEGATIVE
Glucose, UA: NEGATIVE mg/dL
Hgb urine dipstick: NEGATIVE
Ketones, ur: 15 mg/dL — AB
Leukocytes,Ua: NEGATIVE
Nitrite: NEGATIVE
Protein, ur: NEGATIVE mg/dL
Specific Gravity, Urine: 1.02 (ref 1.005–1.030)
pH: 8 (ref 5.0–8.0)

## 2021-05-23 LAB — I-STAT BETA HCG BLOOD, ED (MC, WL, AP ONLY): I-stat hCG, quantitative: <5 m[IU]/mL (ref <5)

## 2021-05-23 LAB — LIPASE, BLOOD: Lipase: 27 U/L (ref 11–51)

## 2021-05-23 LAB — CBC
HCT: 43.3 % (ref 36.0–46.0)
Hemoglobin: 14.5 g/dL (ref 12.0–15.0)
MCH: 34.2 pg — ABNORMAL HIGH (ref 26.0–34.0)
MCHC: 33.5 g/dL (ref 30.0–36.0)
MCV: 102.1 fL — ABNORMAL HIGH (ref 80.0–100.0)
Platelets: 251 10*3/uL (ref 150–400)
RBC: 4.24 MIL/uL (ref 3.87–5.11)
RDW: 12.7 % (ref 11.5–15.5)
WBC: 15.1 10*3/uL — ABNORMAL HIGH (ref 4.0–10.5)
nRBC: 0 % (ref 0.0–0.2)

## 2021-05-23 LAB — SARS CORONAVIRUS 2 (TAT 6-24 HRS): SARS Coronavirus 2: NEGATIVE

## 2021-05-23 MED ORDER — BENZONATATE 100 MG PO CAPS
100.0000 mg | ORAL_CAPSULE | Freq: Once | ORAL | Status: AC
  Administered 2021-05-23: 100 mg via ORAL
  Filled 2021-05-23: qty 1

## 2021-05-23 MED ORDER — ACETAMINOPHEN 500 MG PO TABS
1000.0000 mg | ORAL_TABLET | Freq: Once | ORAL | Status: AC
  Administered 2021-05-23: 1000 mg via ORAL
  Filled 2021-05-23: qty 2

## 2021-05-23 MED ORDER — ACETAMINOPHEN 500 MG PO TABS: 500.0000 mg | ORAL_TABLET | Freq: Four times a day (QID) | ORAL | 0 refills | Status: DC | PRN

## 2021-05-23 MED ORDER — ONDANSETRON HCL 4 MG/2ML IJ SOLN
4.0000 mg | Freq: Once | INTRAMUSCULAR | Status: AC
  Administered 2021-05-23: 4 mg via INTRAVENOUS
  Filled 2021-05-23: qty 2

## 2021-05-23 MED ORDER — LIDOCAINE VISCOUS HCL 2 % MT SOLN
15.0000 mL | Freq: Once | OROMUCOSAL | Status: AC
  Administered 2021-05-23: 15 mL via ORAL
  Filled 2021-05-23: qty 15

## 2021-05-23 MED ORDER — ALUM & MAG HYDROXIDE-SIMETH 200-200-20 MG/5ML PO SUSP
30.0000 mL | Freq: Once | ORAL | Status: AC
  Administered 2021-05-23: 30 mL via ORAL
  Filled 2021-05-23: qty 30

## 2021-05-23 MED ORDER — SODIUM CHLORIDE 0.9 % IV BOLUS
1000.0000 mL | Freq: Once | INTRAVENOUS | Status: AC
  Administered 2021-05-23: 1000 mL via INTRAVENOUS

## 2021-05-23 MED ORDER — BENZONATATE 100 MG PO CAPS: 100.0000 mg | ORAL_CAPSULE | Freq: Three times a day (TID) | ORAL | 0 refills | Status: DC

## 2021-05-23 NOTE — Discharge Instructions (Addendum)
You have symptoms suggestive of potential COVID infection.  Please check results through MyChart, link below.  If test positive, follow instruction below.  Recommendations for at home COVID-19 symptoms management:  Please continue isolation at home. Call [phone] to see whether you might be eligible for therapeutic antibody infusions (leave your name and they will call you back).  If have acute worsening of symptoms please go to ER/urgent care for further evaluation. Check pulse oximetry and if below 90-92% please go to ER. The following supplements MAY help:  Vitamin C 500mg  twice a day and Quercetin 250-500 mg twice a day Vitamin D3 2000 - 4000 u/day B Complex vitamins Zinc 75-100 mg/day Melatonin 6-10 mg at night (the optimal dose is unknown) Aspirin 81mg /day (if no history of bleeding issues)

## 2021-05-23 NOTE — ED Provider Notes (Signed)
[facility]EMERGENCY [facility]PT Provider Note   CSN: [id number] Arrival date & time: 05/23/21  8315     History Chief Complaint  Patient presents with   Abdominal Pain   Cough   Emesis    Gloria Heath is a 35 y.o. female.  The history is provided by the patient and medical records. No language interpreter was used.  Abdominal Pain Associated symptoms: cough and vomiting   Cough Emesis Associated symptoms: abdominal pain and cough    35 year old female significant history of anxiety and depression and polysubstance use who presents complaining of cold symptoms.  On for the past 2 to 3 days patient has had subjective fever, chills, headache, body aches, congestion, cough, posttussive emesis, myalgia, decrease in appetite, and pain to her chest and abdomen from persistent coughing.  She tries to self medicate with alcohol and admits to consuming a moderate amount of alcohol last night which did not help.  She has not been vaccinated for COVID-19 but has had COVID in the past.  She denies any recent sick contact.  No urinary symptoms.  Denies significant shortness of breath.  Her symptom is moderate to severe.  Past Medical History:  Diagnosis Date   Anxiety    Depression    HPV (human papilloma virus) infection 2006   cleared on it's own, no treatment.     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 No pertinent surgical history.   OB History     Gravida  2   Para  1   Term  1   Preterm      AB  0   Living  1      SAB      IAB  0   Ectopic      Multiple      Live Births              Family History  Problem Relation Age of Onset   Polycystic ovary syndrome Sister    Polycystic ovary syndrome Maternal Grandmother    Cancer Maternal Grandmother        lung   Cancer  Paternal Grandfather        lung    Social History   Tobacco Use   Smoking status: Former    Packs/day: 0.00    Types: Cigarettes   Smokeless tobacco: Never  Vaping Use   Vaping Use: Never used  Substance Use Topics   Alcohol use: Yes    Alcohol/week: 4.0 standard drinks    Types: 2 Glasses of wine, 2 Cans of beer per week    Comment: social   Drug use: Not Currently    Types: Cocaine    Home Medications Prior to Admission medications   Medication Sig Start Date End Date Taking? Authorizing Provider  doxycycline (VIBRAMYCIN) 100 MG capsule Take 1 capsule (100 mg total) by mouth 2 (two) times daily. 04/13/20   Georgetta Haber, NP  hydrOXYzine (ATARAX/VISTARIL) 25 MG tablet Take 1 tablet (25 mg total) by mouth 3 (three) times daily as needed for anxiety. Patient not taking: Reported on 07/28/2018 07/02/18   Money, Gerlene Burdock, FNP  metroNIDAZOLE (FLAGYL) 500 MG tablet Take 1 tablet (500 mg total) by mouth 2 (two) times daily. 04/18/20   Merrilee Jansky, MD  QUEtiapine (SEROQUEL) 50 MG tablet Take  50 mg by mouth at bedtime as needed (sleep aid).    [provider]  traZODone (DESYREL) 50 MG tablet Take 1 tablet (50 mg total) by mouth at bedtime as needed for sleep. Patient not taking: Reported on 07/28/2018 07/02/18   Money, Gerlene Burdock, FNP    Allergies    Patient has no known allergies.  Review of Systems   Review of Systems  Respiratory:  Positive for cough.   Gastrointestinal:  Positive for abdominal pain and vomiting.  All other systems reviewed and are negative.  Physical Exam Updated Vital Signs BP 140/89 (BP Location: Right Arm)   Pulse 89   Temp 99.2 F (37.3 C) (Oral)   Resp 16   Ht 5\' 5"  (1.651 m)   Wt 81.6 kg   LMP 05/09/2021 (Approximate)   SpO2 100%   BMI 29.95 kg/m   Physical Exam Vitals and nursing note reviewed.  Constitutional:      Appearance: She is well-developed.     Comments: Patient appears uncomfortable but nontoxic  HENT:      Head: Atraumatic.  Eyes:     Conjunctiva/sclera: Conjunctivae normal.  Cardiovascular:     Rate and Rhythm: Normal rate and regular rhythm.     Pulses: Normal pulses.     Heart sounds: Normal heart sounds.  Pulmonary:     Effort: Pulmonary effort is normal.     Breath sounds: No wheezing, rhonchi or rales.  Abdominal:     Palpations: Abdomen is soft.     Tenderness: There is abdominal tenderness (Mild diffuse tenderness without focal point tenderness.). There is no guarding or rebound.  Musculoskeletal:     Cervical back: Normal range of motion and neck supple. No rigidity.  Skin:    Findings: No rash.  Neurological:     Mental Status: She is alert and oriented to person, place, and time.  Psychiatric:        Mood and Affect: Mood normal.    ED Results / Procedures / Treatments   Labs (all labs ordered are listed, but only abnormal results are displayed) Labs Reviewed  CBC - Abnormal; Notable for the following components:      Result Value   WBC 15.1 (*)    MCV 102.1 (*)    MCH 34.2 (*)    All other components within normal limits  SARS CORONAVIRUS 2 (TAT 6-24 HRS)  LIPASE, BLOOD  COMPREHENSIVE METABOLIC PANEL  URINALYSIS, ROUTINE W REFLEX MICROSCOPIC  I-STAT BETA HCG BLOOD, ED ([facility], [facility], [facility] ONLY)    EKG None  Radiology DG Chest Portable 1 View  Result Date: 05/23/2021 CLINICAL DATA:  Cough. EXAM: PORTABLE CHEST 1 VIEW COMPARISON:  None. FINDINGS: The heart size and mediastinal contours are within normal limits. Both lungs are clear. The visualized skeletal structures are unremarkable. IMPRESSION: No active disease. Electronically Signed   By: 05/25/2021 M.D.   On: 05/23/2021 08:52    Procedures Procedures   Medications Ordered in ED Medications  benzonatate (TESSALON) capsule 100 mg (100 mg Oral Given 05/23/21 0846)  acetaminophen (TYLENOL) tablet 1,000 mg (1,000 mg Oral Given 05/23/21 0846)  ondansetron (ZOFRAN) injection 4 mg (4 mg Intravenous Given 05/23/21  0846)  sodium chloride 0.9 % bolus 1,000 mL (1,000 mLs Intravenous New Bag/Given 05/23/21 0846)  alum & mag hydroxide-simeth (MAALOX/MYLANTA) 200-200-20 MG/5ML suspension 30 mL (30 mLs Oral Given 05/23/21 0905)    And  lidocaine (XYLOCAINE) 2 % viscous mouth solution 15 mL (15 mLs Oral Given  05/23/21 1610)    ED Course  I have reviewed the triage vital signs and the nursing notes.  Pertinent labs & imaging results that were available during my care of the patient were reviewed by me and considered in my medical decision making (see chart for details).    MDM Rules/Calculators/A&P                           BP 140/89 (BP Location: Right Arm)   Pulse 89   Temp 99.2 F (37.3 C) (Oral)   Resp 16   Ht 5\' 5"  (1.651 m)   Wt 81.6 kg   LMP 05/09/2021 (Approximate)   SpO2 100%   BMI 29.95 kg/m   Final Clinical Impression(s) / ED Diagnoses Final diagnoses:  Suspected COVID-19 virus infection    Rx / DC Orders ED Discharge Orders          Ordered    benzonatate (TESSALON) 100 MG capsule  Every 8 hours        05/23/21 1125    acetaminophen (TYLENOL) 500 MG tablet  Every 6 hours PRN        05/23/21 1125           Patient here with cold symptoms.  I suspect this is likely COVID infection.  She does endorse drinking quite a bit of alcohol to self medicate her symptoms.  She does have diffuse abdominal tenderness.  Will check lipase.  11:20 AM White count elevated at 15.1.  Chest x-ray unremarkable.  Remainder of her labs are reassuring.  Normal lipase.  COVID test is currently pending.  Patient reports feeling much better after receiving symptomatic treatment.  I suspect her symptom is likely COVID.  I gave patient symptom control as well as appropriate monitoring and return precaution.  Gloria Heath was evaluated in Emergency Department on 05/23/2021 for the symptoms described in the history of present illness. She was evaluated in the context of the global COVID-19 pandemic, which  necessitated consideration that the patient might be at risk for infection with the SARS-CoV-2 virus that causes COVID-19. Institutional protocols and algorithms that pertain to the evaluation of patients at risk for COVID-19 are in a state of rapid change based on information released by regulatory bodies including the CDC and federal and state organizations. These policies and algorithms were followed during the patient's care in the ED.    05/25/2021, PA-C 05/23/21 1127    05/25/21, MD 05/24/21 [id number]

## 2021-05-23 NOTE — ED Notes (Signed)
Pt states cannot provide urine at this time

## 2021-05-23 NOTE — ED Triage Notes (Signed)
Patient c/o LUQ abdominal pain and emesis snce last night.  Patient also c/o a productive cough with brown sputum x 2 days.

## 2021-06-01 ENCOUNTER — Emergency Department (HOSPITAL_COMMUNITY): Payer: Self-pay

## 2021-06-01 ENCOUNTER — Encounter (HOSPITAL_COMMUNITY): Payer: Self-pay

## 2021-06-01 ENCOUNTER — Emergency Department (HOSPITAL_COMMUNITY)
Admission: EM | Admit: 2021-06-01 | Discharge: 2021-06-01 | Disposition: A | Discharge status: Home / Self Care | Payer: Self-pay | Attending: Emergency Medicine | Admitting: Emergency Medicine

## 2021-06-01 ENCOUNTER — Other Ambulatory Visit: Payer: Self-pay

## 2021-06-01 DIAGNOSIS — R059 Cough, unspecified: Secondary | ICD-10-CM

## 2021-06-01 DIAGNOSIS — R0602 Shortness of breath: Secondary | ICD-10-CM | POA: Insufficient documentation

## 2021-06-01 DIAGNOSIS — R079 Chest pain, unspecified: Secondary | ICD-10-CM

## 2021-06-01 DIAGNOSIS — R000 Tachycardia, unspecified: Secondary | ICD-10-CM | POA: Insufficient documentation

## 2021-06-01 DIAGNOSIS — Z87891 Personal history of nicotine dependence: Secondary | ICD-10-CM | POA: Insufficient documentation

## 2021-06-01 DIAGNOSIS — R61 Generalized hyperhidrosis: Secondary | ICD-10-CM | POA: Insufficient documentation

## 2021-06-01 DIAGNOSIS — Z20822 Contact with and (suspected) exposure to covid-19: Secondary | ICD-10-CM | POA: Insufficient documentation

## 2021-06-01 DIAGNOSIS — M940 Chondrocostal junction syndrome [Tietze]: Secondary | ICD-10-CM

## 2021-06-01 LAB — CBC
HCT: 42.9 % (ref 36.0–46.0)
Hemoglobin: 15 g/dL (ref 12.0–15.0)
MCH: 33.7 pg (ref 26.0–34.0)
MCHC: 35 g/dL (ref 30.0–36.0)
MCV: 96.4 fL (ref 80.0–100.0)
Platelets: 314 10*3/uL (ref 150–400)
RBC: 4.45 MIL/uL (ref 3.87–5.11)
RDW: 12.1 % (ref 11.5–15.5)
WBC: 15.1 10*3/uL — ABNORMAL HIGH (ref 4.0–10.5)
nRBC: 0 % (ref 0.0–0.2)

## 2021-06-01 LAB — BASIC METABOLIC PANEL
Anion gap: 12 (ref 5–15)
BUN: 26 mg/dL — ABNORMAL HIGH (ref 6–20)
CO2: 20 mmol/L — ABNORMAL LOW (ref 22–32)
Calcium: 9.2 mg/dL (ref 8.9–10.3)
Chloride: 103 mmol/L (ref 98–111)
Creatinine, Ser: 0.72 mg/dL (ref 0.44–1.00)
GFR, Estimated: >60 mL/min (ref >60)
Glucose, Bld: 95 mg/dL (ref 70–99)
Potassium: 3.6 mmol/L (ref 3.5–5.1)
Sodium: 135 mmol/L (ref 135–145)

## 2021-06-01 LAB — I-STAT BETA HCG BLOOD, ED (MC, WL, AP ONLY): I-stat hCG, quantitative: <5 m[IU]/mL (ref <5)

## 2021-06-01 LAB — RESP PANEL BY RT-PCR (FLU A&B, COVID) ARPGX2
Influenza A by PCR: NEGATIVE
Influenza B by PCR: NEGATIVE
SARS Coronavirus 2 by RT PCR: NEGATIVE

## 2021-06-01 LAB — TROPONIN I (HIGH SENSITIVITY): Troponin I (High Sensitivity): <2 ng/L (ref <18)

## 2021-06-01 MED ORDER — HYDROCODONE BIT-HOMATROP MBR 5-1.5 MG/5ML PO SOLN
5.0000 mL | Freq: Once | ORAL | Status: AC
  Administered 2021-06-01: 5 mL via ORAL
  Filled 2021-06-01: qty 5

## 2021-06-01 MED ORDER — HYDROCODONE BIT-HOMATROP MBR 5-1.5 MG/5ML PO SOLN: 5.0000 mL | Freq: Four times a day (QID) | ORAL | 0 refills | Status: DC | PRN

## 2021-06-01 MED ORDER — SODIUM CHLORIDE 0.9 % IV BOLUS
1000.0000 mL | Freq: Once | INTRAVENOUS | Status: AC
  Administered 2021-06-01: 1000 mL via INTRAVENOUS

## 2021-06-01 MED ORDER — IOHEXOL 350 MG/ML SOLN
80.0000 mL | Freq: Once | INTRAVENOUS | Status: AC | PRN
  Administered 2021-06-01: 80 mL via INTRAVENOUS

## 2021-06-01 MED ORDER — KETOROLAC TROMETHAMINE 15 MG/ML IJ SOLN
15.0000 mg | Freq: Once | INTRAMUSCULAR | Status: AC
  Administered 2021-06-01: 15 mg via INTRAVENOUS
  Filled 2021-06-01: qty 1

## 2021-06-01 MED ORDER — PREDNISONE 10 MG PO TABS: 20.0000 mg | ORAL_TABLET | Freq: Every day | ORAL | 0 refills | Status: AC

## 2021-06-01 MED ORDER — GUAIFENESIN-DM 100-10 MG/5ML PO SYRP
10.0000 mL | ORAL_SOLUTION | Freq: Once | ORAL | Status: AC
  Administered 2021-06-01: 10 mL via ORAL
  Filled 2021-06-01: qty 10

## 2021-06-01 NOTE — ED Provider Notes (Signed)
Care assumed from [place], [place], at shift change, please see their notes for full documentation of patient's complaint/HPI. Briefly, pt here with complaint of cough x 2 weeks and chest pain with negative COVID test. Results so far show negative troponin  < 2 and clear CXR. Awaiting CTA at this time as pt initially presented with HR at 131. Plan is to dispo accordingly.   Physical Exam  BP (!) 117/95   Pulse 77   Temp 98.3 F (36.8 C) (Oral)   Resp 18   Ht 5\' 5"  (1.651 m)   Wt 82 kg   LMP 05/18/2021 (Approximate)   SpO2 100%   BMI 30.08 kg/m   Physical Exam Vitals and nursing note reviewed.  Constitutional:      Appearance: She is not ill-appearing.  HENT:     Head: Normocephalic and atraumatic.  Eyes:     Conjunctiva/sclera: Conjunctivae normal.  Cardiovascular:     Rate and Rhythm: Normal rate and regular rhythm.  Pulmonary:     Effort: Pulmonary effort is normal.     Breath sounds: Normal breath sounds.  Skin:    General: Skin is warm and dry.     Coloration: Skin is not jaundiced.  Neurological:     Mental Status: She is alert.    ED Course/Procedures     Procedures  Results for orders placed or performed during the hospital encounter of 06/01/21  Resp Panel by RT-PCR (Flu A&B, Covid) Nasopharyngeal Swab   Specimen: Nasopharyngeal Swab; Nasopharyngeal(NP) swabs in vial transport medium  Result Value Ref Range   SARS Coronavirus 2 by RT PCR NEGATIVE NEGATIVE   Influenza A by PCR NEGATIVE NEGATIVE   Influenza B by PCR NEGATIVE NEGATIVE  Basic metabolic panel  Result Value Ref Range   Sodium 135 135 - 145 mmol/L   Potassium 3.6 3.5 - 5.1 mmol/L   Chloride 103 98 - 111 mmol/L   CO2 20 (L) 22 - 32 mmol/L   Glucose, Bld 95 70 - 99 mg/dL   BUN 26 (H) 6 - 20 mg/dL   Creatinine, Ser 06/03/21 0.44 - 1.00 mg/dL   Calcium 9.2 8.9 - 3.81 mg/dL   GFR, Estimated 82.9 >93 mL/min   Anion gap 12 5 - 15  CBC  Result Value Ref Range   WBC 15.1 (H) 4.0 - 10.5 K/uL    RBC 4.45 3.87 - 5.11 MIL/uL   Hemoglobin 15.0 12.0 - 15.0 g/dL   HCT >71 69.6 - 78.9 %   MCV 96.4 80.0 - 100.0 fL   MCH 33.7 26.0 - 34.0 pg   MCHC 35.0 30.0 - 36.0 g/dL   RDW 38.1 01.7 - 51.0 %   Platelets 314 150 - 400 K/uL   nRBC 0.0 0.0 - 0.2 %  I-Stat beta hCG blood, ED  Result Value Ref Range   I-stat hCG, quantitative <5.0 <5 mIU/mL   Comment 3          Troponin I (High Sensitivity)  Result Value Ref Range   Troponin I (High Sensitivity) <2 <18 ng/L   DG Chest 2 View  Result Date: 06/01/2021 CLINICAL DATA:  Chest pain, cough.  Former smoker. EXAM: CHEST - 2 VIEW COMPARISON:  Chest x-ray dated 05/23/2021 FINDINGS: Heart size and mediastinal contours are within normal limits. Lungs are clear. No pleural effusion or pneumothorax is seen. No acute-appearing osseous abnormality. IMPRESSION: No active cardiopulmonary disease. No evidence of pneumonia or pulmonary edema. Electronically Signed   By: 05/25/2021  Linde Gillis M.D.   On: 06/01/2021 13:46   CT Angio Chest PE W and/or Wo Contrast  Result Date: 06/01/2021 CLINICAL DATA:  Cough, chest pain.  PE suspected. EXAM: CT ANGIOGRAPHY CHEST WITH CONTRAST TECHNIQUE: Multidetector CT imaging of the chest was performed using the standard protocol during bolus administration of intravenous contrast. Multiplanar CT image reconstructions and MIPs were obtained to evaluate the vascular anatomy. CONTRAST:  49mL OMNIPAQUE IOHEXOL 350 MG/ML SOLN COMPARISON:  None. FINDINGS: Cardiovascular: Evaluation of the pulmonary arteries is significantly limited by patient breathing motion artifact. There is no large central pulmonary embolism within the main or lobar pulmonary arteries. No convincing pulmonary embolism within the most central segmental pulmonary arteries. The more peripheral bilateral segmental and subsegmental pulmonary arteries cannot be evaluated for PE. No thoracic aortic aneurysm.  No pericardial effusion. Mediastinum/Nodes: No mass or enlarged lymph  nodes are seen within the mediastinum or perihilar regions. Esophagus is unremarkable. Trachea and central bronchi are unremarkable. Lungs/Pleura: Lungs are clear.  No pleural effusion or pneumothorax. Upper Abdomen: Limited images of the upper abdomen are unremarkable. Musculoskeletal: No acute or significant osseous finding. Mild degenerative spondylosis of the lower thoracic spine. Review of the MIP images confirms the above findings. IMPRESSION: 1. Evaluation of the pulmonary arteries is significantly limited by patient breathing motion artifact. All that can be confidently stated is that there is no large central pulmonary embolism within the main or central lobar pulmonary arteries. Segmental and subsegmental pulmonary arteries of each lung cannot be definitively evaluated for PE. If concern for pulmonary embolism persists, and patient can perform an adequate breath hold, and renal function allows, would consider a repeat chest CT angiogram. 2. No acute findings. Lungs are clear. No pleural effusion or pneumothorax. Electronically Signed   By: Bary Richard M.D.   On: 06/01/2021 16:03    MDM  CTA limited by patient breathing/unable to take a deep breath. Unable to fully rule out lower segmental PE. I had shared decision making with patient regarding CT scan findings and potential to re-try after pain control. Pt would like something for cough which is her biggest complaint. She denies any risk factors for PE including history of same, exogenous hormone use, recent prolonged travel or immobilization, hemoptysis, or active malignancy. Given her troponin is negative and her heart rate has improved with main complaint of cough I have lower suspicion for same at this time. I think it is reasonable to hold off on repeating CT scan today. Have discussed with attending physician Dr. Dalene Seltzer who agrees. Will provide hycodan in the ED and reevaluate.   On reeval after Hycodan pt reports improvement in cough. She  is resting comfortably in the bed without active coughing. Will discharge home with same and prednisone. Pt instructed to follow up with PCP for further eval and to return to the ED for any new/worsening symptoms. Stable for discharge at this time.   This note was prepared using Dragon voice recognition software and may include unintentional dictation errors due to the inherent limitations of voice recognition software.      Tanda Rockers, PA-C 06/01/21 1757    Alvira Monday, MD 06/03/21 1504

## 2021-06-01 NOTE — ED Triage Notes (Signed)
Pt reports cough and feeling unwell for 2 weeks. Pt also endorse chest pain that has been progressively worse and now radiates to her back. Pt is hyperventilating and tearful in triage.

## 2021-06-01 NOTE — ED Provider Notes (Signed)
[facility]-EMERGENCY DEPT Provider Note   CSN: [id number] Arrival date & time: 06/01/21  1251     History Chief Complaint  Patient presents with   Chest Pain   Shortness of Breath    Gloria Heath is a 35 y.o. female who presents with 2 weeks of cough, malaise, lack of appetite, constipation, diarrhea who is now complaining of severe chest pain.  Patient reports she has been afebrile, was negative for COVID last evaluation 2 weeks ago.  She believes her chest pain shortness of breath is secondary to her protracted cough with no relief for the last several weeks, she reports some relief with Jerilynn Som but reports that this only lasts for about 1 hour.  She denies any other past medical history, reports no regular medication.  She denies recent travel, hematemesis, history of cancer.  She denies exertional chest pain, reports no radiation to her arms, reports the chest pain is pleuritic in nature.   Chest Pain Associated symptoms: cough and shortness of breath   Associated symptoms: no palpitations   Shortness of Breath Associated symptoms: chest pain and cough       Past Medical History:  Diagnosis Date   Anxiety    Depression    HPV (human papilloma virus) infection 2006   cleared on it's own, no treatment.     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 No pertinent surgical history.   OB History     Gravida  2   Para  1   Term  1   Preterm      AB  0   Living  1      SAB      IAB  0   Ectopic      Multiple      Live Births              Family History  Problem Relation Age of Onset   Polycystic ovary syndrome Sister    Polycystic ovary syndrome Maternal Grandmother    Cancer Maternal Grandmother        lung   Cancer Paternal Grandfather        lung     Social History   Tobacco Use   Smoking status: Former    Packs/day: 0.00    Types: Cigarettes   Smokeless tobacco: Never  Vaping Use   Vaping Use: Never used  Substance Use Topics   Alcohol use: Yes    Alcohol/week: 4.0 standard drinks    Types: 2 Glasses of wine, 2 Cans of beer per week    Comment: social   Drug use: Not Currently    Types: Cocaine    Home Medications Prior to Admission medications   Medication Sig Start Date End Date Taking? Authorizing Provider  acetaminophen (TYLENOL) 500 MG tablet Take 1 tablet (500 mg total) by mouth every 6 (six) hours as needed. 05/23/21   Fayrene Helper, PA-C  benzonatate (TESSALON) 100 MG capsule Take 1 capsule (100 mg total) by mouth every 8 (eight) hours. 05/23/21   Fayrene Helper, PA-C  ibuprofen (ADVIL) 200 MG tablet Take 600 mg by mouth every 6 (six) hours as needed for fever, headache or mild pain.    [provider]    Allergies    Patient has no known allergies.  Review of  Systems   Review of Systems  Respiratory:  Positive for cough and shortness of breath.   Cardiovascular:  Positive for chest pain. Negative for palpitations and leg swelling.   Physical Exam Updated Vital Signs BP 126/84   Pulse 82   Temp 98.3 F (36.8 C) (Oral)   Resp 14   Ht 5\' 5"  (1.651 m)   Wt 82 kg   LMP 05/18/2021 (Approximate)   SpO2 98%   BMI 30.08 kg/m   Physical Exam Vitals and nursing note reviewed.  Constitutional:      Appearance: Normal appearance. She is diaphoretic.     Comments: Patient is sitting up in bed in some distress, breathing with deep labored respirations, somewhat diaphoretic.  HENT:     Head: Normocephalic and atraumatic.     Nose: No rhinorrhea.     Mouth/Throat:     Pharynx: No oropharyngeal exudate.  Eyes:     General:        Right eye: No discharge.        Left eye: No discharge.  Cardiovascular:     Rate and Rhythm: Regular rhythm. Tachycardia present.     Heart sounds: No murmur heard.   No  friction rub. No gallop.  Pulmonary:     Effort: Pulmonary effort is normal.     Breath sounds: Rhonchi present.     Comments: Patient with intermittent cough without sputum production Abdominal:     General: Bowel sounds are normal.     Palpations: Abdomen is soft.  Skin:    General: Skin is warm.     Capillary Refill: Capillary refill takes less than 2 seconds.  Neurological:     Mental Status: She is alert and oriented to person, place, and time.  Psychiatric:        Mood and Affect: Mood normal.        Behavior: Behavior normal.    ED Results / Procedures / Treatments   Labs (all labs ordered are listed, but only abnormal results are displayed) Labs Reviewed  BASIC METABOLIC PANEL - Abnormal; Notable for the following components:      Result Value   CO2 20 (*)    BUN 26 (*)    All other components within normal limits  CBC - Abnormal; Notable for the following components:   WBC 15.1 (*)    All other components within normal limits  RESP PANEL BY RT-PCR (FLU A&B, COVID) ARPGX2  I-STAT BETA HCG BLOOD, ED ([facility], [facility], [facility] ONLY)  TROPONIN I (HIGH SENSITIVITY)    EKG EKG Interpretation  Date/Time:  Saturday June 01 2021 13:00:10 EDT Ventricular Rate:  100 PR Interval:  118 QRS Duration: 107 QT Interval:  361 QTC Calculation: 466 R Axis:   81 Text Interpretation: Sinus tachycardia Atrial premature complex Biatrial enlargement Borderline repolarization abnormality Confirmed by [id number]) on 06/01/2021 1:12:08 PM  Radiology DG Chest 2 View  Result Date: 06/01/2021 CLINICAL DATA:  Chest pain, cough.  Former smoker. EXAM: CHEST - 2 VIEW COMPARISON:  Chest x-ray dated 05/23/2021 FINDINGS: Heart size and mediastinal contours are within normal limits. Lungs are clear. No pleural effusion or pneumothorax is seen. No acute-appearing osseous abnormality. IMPRESSION: No active cardiopulmonary disease. No evidence of pneumonia or pulmonary edema. Electronically Signed   By:  05/25/2021 M.D.   On: 06/01/2021 13:46    Procedures Procedures   Medications Ordered in ED Medications  guaiFENesin-dextromethorphan (ROBITUSSIN DM) 100-10 MG/5ML syrup 10 mL (10 mLs Oral  Given 06/01/21 1406)  ketorolac (TORADOL) 15 MG/ML injection 15 mg (15 mg Intravenous Given 06/01/21 1406)  sodium chloride 0.9 % bolus 1,000 mL (1,000 mLs Intravenous New Bag/Given 06/01/21 1407)    ED Course  I have reviewed the triage vital signs and the nursing notes.  Pertinent labs & imaging results that were available during my care of the patient were reviewed by me and considered in my medical decision making (see chart for details).    MDM Rules/Calculators/A&P                          A broad differential for chest pain shortness of breath was considered for this patient including but not limited to esophageal rupture, ACS, pulmonary embolism, pneumonia, bronchitis, aortic dissection, pericarditis, myocarditis.  Chest x-ray without any evidence of active cardiopulmonary disease, initial lab work-up is reassuring including negative troponin. Patient is mildly acidotic with no anion gap, otherwise normal BNP.  A broad differential for non-anion ion gap metabolic acidosis was considered, but clinical picture and condition is most consistent with reported diarrhea.  3:12 PM Care of Lashaun Poch transferred to [facility] and Dr. Dalene Seltzer at the end of my shift as the patient will require reassessment once labs/imaging have resulted. Patient presentation, ED course, and plan of care discussed with review of all pertinent labs and imaging. Please see his/her note for further details regarding further ED course and disposition. Plan at time of handoff is to treat supportively with ibuprofen, cough medication and a short course of dexamethasone. If CTA positive then treat for PE. This may be altered or completely changed at the discretion of the oncoming team pending results of further  workup.  Data Reviewed/Counseling: I have reviewed the patient's vital signs, nursing notes, and other relevant tests/information. I had a detailed discussion regarding the historical points, exam findings, and any diagnostic results supporting the discharge diagnosis. I also discussed the need for outpatient follow-up and the need to return to the ED if symptoms worsen or if there are any questions or concerns that arise at home.  Final Clinical Impression(s) / ED Diagnoses Final diagnoses:  None    Rx / DC Orders ED Discharge Orders     None        Olene Floss, PA-C 06/01/21 1515    Virgina Norfolk, DO 06/02/21 0730

## 2021-06-01 NOTE — Discharge Instructions (Addendum)
Please pick up medication and take as prescribed to help with your cough/inflammation  Follow up with your PCP regarding ED visit today and for further eval. If you do not have a PCP you can follow up with [facility] for primary care needs  Return to the ED IMMEDIATELY for any new/worsening symptoms including severe shortness of breath, passing out, coughing up blood

## 2021-09-08 ENCOUNTER — Emergency Department (HOSPITAL_COMMUNITY)
Admission: EM | Admit: 2021-09-08 | Discharge: 2021-09-11 | Disposition: A | Discharge status: Home / Self Care | Payer: Self-pay | Attending: Emergency Medicine | Admitting: Emergency Medicine

## 2021-09-08 ENCOUNTER — Other Ambulatory Visit: Payer: Self-pay

## 2021-09-08 ENCOUNTER — Encounter (HOSPITAL_COMMUNITY): Payer: Self-pay | Admitting: Emergency Medicine

## 2021-09-08 DIAGNOSIS — R456 Violent behavior: Secondary | ICD-10-CM | POA: Insufficient documentation

## 2021-09-08 DIAGNOSIS — J101 Influenza due to other identified influenza virus with other respiratory manifestations: Secondary | ICD-10-CM | POA: Insufficient documentation

## 2021-09-08 DIAGNOSIS — F4389 Other reactions to severe stress: Secondary | ICD-10-CM | POA: Insufficient documentation

## 2021-09-08 DIAGNOSIS — Z20822 Contact with and (suspected) exposure to covid-19: Secondary | ICD-10-CM | POA: Insufficient documentation

## 2021-09-08 DIAGNOSIS — Z79899 Other long term (current) drug therapy: Secondary | ICD-10-CM | POA: Insufficient documentation

## 2021-09-08 DIAGNOSIS — F15259 Other stimulant dependence with stimulant-induced psychotic disorder, unspecified: Secondary | ICD-10-CM | POA: Insufficient documentation

## 2021-09-08 DIAGNOSIS — R4689 Other symptoms and signs involving appearance and behavior: Secondary | ICD-10-CM

## 2021-09-08 DIAGNOSIS — Y900 Blood alcohol level of less than 20 mg/100 ml: Secondary | ICD-10-CM | POA: Insufficient documentation

## 2021-09-08 DIAGNOSIS — F603 Borderline personality disorder: Secondary | ICD-10-CM | POA: Diagnosis present

## 2021-09-08 DIAGNOSIS — F15950 Other stimulant use, unspecified with stimulant-induced psychotic disorder with delusions: Secondary | ICD-10-CM

## 2021-09-08 DIAGNOSIS — F15959 Other stimulant use, unspecified with stimulant-induced psychotic disorder, unspecified: Secondary | ICD-10-CM | POA: Diagnosis present

## 2021-09-08 DIAGNOSIS — F4310 Post-traumatic stress disorder, unspecified: Secondary | ICD-10-CM | POA: Diagnosis present

## 2021-09-08 LAB — RAPID URINE DRUG SCREEN, HOSP PERFORMED
Amphetamines: POSITIVE — AB
Barbiturates: NOT DETECTED
Benzodiazepines: NOT DETECTED
Cocaine: NOT DETECTED
Opiates: POSITIVE — AB
Tetrahydrocannabinol: POSITIVE — AB

## 2021-09-08 LAB — COMPREHENSIVE METABOLIC PANEL
ALT: 18 U/L (ref 0–44)
AST: 23 U/L (ref 15–41)
Albumin: 4 g/dL (ref 3.5–5.0)
Alkaline Phosphatase: 55 U/L (ref 38–126)
Anion gap: 5 (ref 5–15)
BUN: 11 mg/dL (ref 6–20)
CO2: 28 mmol/L (ref 22–32)
Calcium: 8.7 mg/dL — ABNORMAL LOW (ref 8.9–10.3)
Chloride: 103 mmol/L (ref 98–111)
Creatinine, Ser: 0.49 mg/dL (ref 0.44–1.00)
GFR, Estimated: >60 mL/min (ref >60)
Glucose, Bld: 93 mg/dL (ref 70–99)
Potassium: 3.8 mmol/L (ref 3.5–5.1)
Sodium: 136 mmol/L (ref 135–145)
Total Bilirubin: 0.5 mg/dL (ref 0.3–1.2)
Total Protein: 7.7 g/dL (ref 6.5–8.1)

## 2021-09-08 LAB — CBC WITH DIFFERENTIAL/PLATELET
Abs Immature Granulocytes: 0.01 10*3/uL (ref 0.00–0.07)
Basophils Absolute: 0 10*3/uL (ref 0.0–0.1)
Basophils Relative: 0 %
Eosinophils Absolute: 0 10*3/uL (ref 0.0–0.5)
Eosinophils Relative: 1 %
HCT: 40.9 % (ref 36.0–46.0)
Hemoglobin: 13.7 g/dL (ref 12.0–15.0)
Immature Granulocytes: 0 %
Lymphocytes Relative: 31 %
Lymphs Abs: 1.6 10*3/uL (ref 0.7–4.0)
MCH: 33.9 pg (ref 26.0–34.0)
MCHC: 33.5 g/dL (ref 30.0–36.0)
MCV: 101.2 fL — ABNORMAL HIGH (ref 80.0–100.0)
Monocytes Absolute: 0.7 10*3/uL (ref 0.1–1.0)
Monocytes Relative: 14 %
Neutro Abs: 2.8 10*3/uL (ref 1.7–7.7)
Neutrophils Relative %: 54 %
Platelet Morphology: NORMAL
Platelets: 240 10*3/uL (ref 150–400)
RBC: 4.04 MIL/uL (ref 3.87–5.11)
RDW: 12.8 % (ref 11.5–15.5)
WBC: 5.2 10*3/uL (ref 4.0–10.5)
nRBC: 0 % (ref 0.0–0.2)

## 2021-09-08 LAB — RESP PANEL BY RT-PCR (FLU A&B, COVID) ARPGX2
Influenza A by PCR: POSITIVE — AB
Influenza B by PCR: NEGATIVE
SARS Coronavirus 2 by RT PCR: NEGATIVE

## 2021-09-08 LAB — I-STAT BETA HCG BLOOD, ED (MC, WL, AP ONLY): I-stat hCG, quantitative: <5 m[IU]/mL (ref <5)

## 2021-09-08 LAB — ETHANOL: Alcohol, Ethyl (B): <10 mg/dL (ref <10)

## 2021-09-08 MED ORDER — ACETAMINOPHEN 325 MG PO TABS: 650.0000 mg | ORAL_TABLET | ORAL | Status: DC | PRN

## 2021-09-08 MED ORDER — ONDANSETRON HCL 4 MG PO TABS: 4.0000 mg | ORAL_TABLET | Freq: Three times a day (TID) | ORAL | Status: DC | PRN

## 2021-09-08 MED ORDER — ZOLPIDEM TARTRATE 5 MG PO TABS
5.0000 mg | ORAL_TABLET | Freq: Every evening | ORAL | Status: DC | PRN
  Administered 2021-09-10: 5 mg via ORAL
  Filled 2021-09-08: qty 1

## 2021-09-08 MED ORDER — ALUM & MAG HYDROXIDE-SIMETH 200-200-20 MG/5ML PO SUSP: 30.0000 mL | Freq: Four times a day (QID) | ORAL | Status: DC | PRN

## 2021-09-08 MED ORDER — LORAZEPAM 1 MG PO TABS
1.0000 mg | ORAL_TABLET | ORAL | Status: AC | PRN
  Administered 2021-09-09: 1 mg via ORAL
  Filled 2021-09-08: qty 1

## 2021-09-08 MED ORDER — NICOTINE 21 MG/24HR TD PT24
21.0000 mg | MEDICATED_PATCH | Freq: Every day | TRANSDERMAL | Status: DC
  Administered 2021-09-08 – 2021-09-09 (×3): 21 mg via TRANSDERMAL
  Filled 2021-09-08 (×2): qty 1

## 2021-09-08 MED ORDER — RISPERIDONE 0.5 MG PO TBDP
2.0000 mg | ORAL_TABLET | Freq: Three times a day (TID) | ORAL | Status: DC | PRN
  Filled 2021-09-08: qty 4

## 2021-09-08 MED ORDER — ZIPRASIDONE MESYLATE 20 MG IM SOLR
20.0000 mg | INTRAMUSCULAR | Status: AC | PRN
  Administered 2021-09-09: 20 mg via INTRAMUSCULAR
  Filled 2021-09-08: qty 20
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
## 2021-09-08 NOTE — [facility] Counselor (Signed)
Requested cart. 

## 2021-09-09 MED ORDER — STERILE WATER FOR INJECTION IJ SOLN
INTRAMUSCULAR | Status: AC
  Filled 2021-09-09: qty 10

## 2021-09-09 MED ORDER — QUETIAPINE FUMARATE 25 MG PO TABS
25.0000 mg | ORAL_TABLET | Freq: Two times a day (BID) | ORAL | Status: DC
  Administered 2021-09-09 – 2021-09-11 (×4): 25 mg via ORAL
  Filled 2021-09-09 (×4): qty 1

## 2021-09-09 NOTE — ED Notes (Signed)
Respirations even and unlabored

## 2021-09-09 NOTE — ED Notes (Signed)
IVC- Inpt Placement

## 2021-09-09 NOTE — ED Notes (Signed)
Pt crying uncontrollably in bed.

## 2021-09-09 NOTE — ED Notes (Signed)
Pt sitting up in bed, staring at staff members. Pt talking about her skin. Pt offered risperidone by RN Abby. Pt refused and stated she doesn't take any psychiatric medications.

## 2021-09-09 NOTE — ED Notes (Signed)
Pt yelling in the hallway "Fuck you Amy." Pt walking back and forth to restroom.

## 2021-09-09 NOTE — ED Provider Notes (Signed)
Emergency Medicine Observation Re-evaluation Note  Gloria Heath is a 35 y.o. female, seen on rounds today.  Pt initially presented to the ED for complaints of Medical Clearance Currently, the patient is resting.  Physical Exam  BP 131/85 (BP Location: Left Arm)   Pulse 92   Temp 99 F (37.2 C) (Oral)   Resp 18   Ht 1.651 m (5\' 5" )   Wt 80 kg   LMP 08/18/2021   SpO2 100%   BMI 29.35 kg/m  Physical Exam General: Sleeping Cardiac: Regular rate Lungs: Breathing easily Psych: Deferred  ED Course / MDM  EKG:   I have reviewed the labs performed to date as well as medications administered while in observation.  Recent changes in the last 24 hours include initial assessment and medical clearance.  Patient noted to have positive influenza test.  She also had multiple substances on her UDS.  Psychiatry recommends inpatient treatment.  Plan  Current plan is for inpatient treatment. Gloria Heath is under involuntary commitment.      [name], MD 09/09/21 [phone]

## 2021-09-09 NOTE — ED Notes (Signed)
Pt sat up in bed and yelled "get me out of here Amy." Pt standing and yelling continuously. PRN geodon administered.

## 2021-09-09 NOTE — ED Notes (Signed)
Pt requesting ativan. PRN ativan administered.

## 2021-09-09 NOTE — ED Notes (Signed)
Pt resting in bed at the moment.
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
## 2021-09-10 DIAGNOSIS — F4310 Post-traumatic stress disorder, unspecified: Secondary | ICD-10-CM

## 2021-09-10 DIAGNOSIS — J101 Influenza due to other identified influenza virus with other respiratory manifestations: Secondary | ICD-10-CM | POA: Insufficient documentation

## 2021-09-10 DIAGNOSIS — R4689 Other symptoms and signs involving appearance and behavior: Secondary | ICD-10-CM | POA: Insufficient documentation

## 2021-09-10 DIAGNOSIS — F15951 Other stimulant use, unspecified with stimulant-induced psychotic disorder with hallucinations: Secondary | ICD-10-CM

## 2021-09-10 DIAGNOSIS — F603 Borderline personality disorder: Secondary | ICD-10-CM

## 2021-09-10 NOTE — ED Notes (Signed)
Pt transferred to TCU room 30. Pt cooperative at this time.  Pt's belongings brought to TCU and stored properly per protocol.  Report given to Scherry Ran., RN.

## 2021-09-10 NOTE — ED Notes (Signed)
Pt offered am meds at this time. Pt refusing them currently. Pt states she does not need nicotine patch and states the seroquel makes her sleepy. Pt states "i'd like to be awake for a little while".  ED RN states understanding and informed pt if she changes her mind the meds are available. Pt states understanding and agreement. Pt calm and cooperative at this time eating breakfast.
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
## 2021-09-10 NOTE — ED Notes (Signed)
Pt given meal tray and toothbrush. Pt corporative.

## 2021-09-11 MED ORDER — QUETIAPINE FUMARATE 25 MG PO TABS: 25.0000 mg | ORAL_TABLET | Freq: Two times a day (BID) | ORAL | 0 refills | Status: DC

## 2021-09-11 NOTE — Discharge Instructions (Addendum)
Remember that each time you use illicit substances your psychosis can return. Each state of psychosis is detrimental to your brain cells, making the condition less likely reversible.    Please continue to take medications as directed. If your symptoms return, worsen, or persist please call your 911, report to local ER, or contact crisis hotline. Please do not drink alcohol or use any illegal substances while taking prescription medications.  For your behavioral health needs you are advised to follow up with [facility] at your earliest opportunity:      [facility]      [address]      [place], [place] [address]      [phone]      They offer psychiatry/medication management and therapy.  New patients are seen in their walk-in clinic.  Walk-in hours are Monday - Thursday from 8:00 am - 11:00 am for psychiatry, and Friday from 1:00 pm - 4:00 pm for therapy.  Walk-in patients are seen on a first come, first served basis, so try to arrive as early as possible for the best chance of being seen the same day.  Please note that to be eligible for services you must bring an ID or a piece of mail with your name and a [facility] address.
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
## 2021-09-11 NOTE — ED Notes (Signed)
Patient calm and cooperative this shift.  No distress noted.  

## 2021-09-11 NOTE — ED Provider Notes (Signed)
Patient cleared by behavioral health for discharge.  IVC rescinded.  Discharge as per behavioral health.   Vanetta Mulders, MD 09/11/21 1110
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
## 2022-01-22 ENCOUNTER — Encounter (HOSPITAL_COMMUNITY): Payer: Self-pay

## 2022-01-22 ENCOUNTER — Other Ambulatory Visit: Payer: Self-pay

## 2022-01-22 ENCOUNTER — Emergency Department (HOSPITAL_COMMUNITY): Payer: Self-pay

## 2022-01-22 ENCOUNTER — Emergency Department (HOSPITAL_COMMUNITY)
Admission: EM | Admit: 2022-01-22 | Discharge: 2022-01-22 | Disposition: A | Discharge status: Home / Self Care | Payer: Self-pay | Attending: Emergency Medicine | Admitting: Emergency Medicine

## 2022-01-22 DIAGNOSIS — S40011A Contusion of right shoulder, initial encounter: Secondary | ICD-10-CM

## 2022-01-22 DIAGNOSIS — E860 Dehydration: Secondary | ICD-10-CM

## 2022-01-22 LAB — I-STAT BETA HCG BLOOD, ED (MC, WL, AP ONLY): I-stat hCG, quantitative: <5 m[IU]/mL (ref <5)

## 2022-01-22 LAB — COMPREHENSIVE METABOLIC PANEL
ALT: 21 U/L (ref 0–44)
AST: 21 U/L (ref 15–41)
Albumin: 3.9 g/dL (ref 3.5–5.0)
Alkaline Phosphatase: 53 U/L (ref 38–126)
Anion gap: 6 (ref 5–15)
BUN: 12 mg/dL (ref 6–20)
CO2: 27 mmol/L (ref 22–32)
Calcium: 9.4 mg/dL (ref 8.9–10.3)
Chloride: 106 mmol/L (ref 98–111)
Creatinine, Ser: 0.59 mg/dL (ref 0.44–1.00)
GFR, Estimated: >60 mL/min (ref >60)
Glucose, Bld: 115 mg/dL — ABNORMAL HIGH (ref 70–99)
Potassium: 4.6 mmol/L (ref 3.5–5.1)
Sodium: 139 mmol/L (ref 135–145)
Total Bilirubin: 0.8 mg/dL (ref 0.3–1.2)
Total Protein: 7 g/dL (ref 6.5–8.1)

## 2022-01-22 LAB — CBC
HCT: 43.6 % (ref 36.0–46.0)
Hemoglobin: 14.4 g/dL (ref 12.0–15.0)
MCH: 34.4 pg — ABNORMAL HIGH (ref 26.0–34.0)
MCHC: 33 g/dL (ref 30.0–36.0)
MCV: 104.1 fL — ABNORMAL HIGH (ref 80.0–100.0)
Platelets: 266 10*3/uL (ref 150–400)
RBC: 4.19 MIL/uL (ref 3.87–5.11)
RDW: 12.9 % (ref 11.5–15.5)
WBC: 10.3 10*3/uL (ref 4.0–10.5)
nRBC: 0 % (ref 0.0–0.2)

## 2022-01-22 MED ORDER — NAPROXEN 500 MG PO TABS
500.0000 mg | ORAL_TABLET | Freq: Once | ORAL | Status: AC
Start: 2022-01-22 — End: 2022-01-22
  Administered 2022-01-22: 500 mg via ORAL
  Filled 2022-01-22: qty 1
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
## 2022-01-22 NOTE — ED Notes (Signed)
Prior to taking Pt to room, Pt began c/o neck pain.  C-collar placed by this Writer.  ?

## 2022-01-22 NOTE — Discharge Instructions (Addendum)
X-ray of your chest and shoulder are reassuring.  Take over-the-counter medications for pain control.  Also apply ice to the tender areas. ? ?The lab work-up is reassuring.  No evidence of any organ damage, however do know that hydration is very important. ?

## 2022-06-22 ENCOUNTER — Encounter (HOSPITAL_COMMUNITY): Payer: Self-pay | Admitting: Emergency Medicine

## 2022-06-22 ENCOUNTER — Emergency Department (HOSPITAL_COMMUNITY)
Admission: EM | Admit: 2022-06-22 | Discharge: 2022-06-22 | Payer: Self-pay | Attending: Emergency Medicine | Admitting: Emergency Medicine

## 2022-06-22 DIAGNOSIS — Z008 Encounter for other general examination: Secondary | ICD-10-CM | POA: Insufficient documentation
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
## 2023-02-02 ENCOUNTER — Inpatient Hospital Stay (HOSPITAL_COMMUNITY)
Admission: EM | Admit: 2023-02-02 | Discharge: 2023-02-05 | DRG: 806 | Disposition: A | Discharge status: Home / Self Care | Payer: MEDICAID | Attending: Family Medicine | Admitting: Family Medicine

## 2023-02-02 ENCOUNTER — Other Ambulatory Visit: Payer: Self-pay

## 2023-02-02 ENCOUNTER — Encounter (HOSPITAL_COMMUNITY): Payer: Self-pay

## 2023-02-02 ENCOUNTER — Inpatient Hospital Stay (HOSPITAL_BASED_OUTPATIENT_CLINIC_OR_DEPARTMENT_OTHER): Payer: MEDICAID

## 2023-02-02 DIAGNOSIS — R030 Elevated blood-pressure reading, without diagnosis of hypertension: Secondary | ICD-10-CM | POA: Diagnosis present

## 2023-02-02 DIAGNOSIS — O09523 Supervision of elderly multigravida, third trimester: Secondary | ICD-10-CM

## 2023-02-02 DIAGNOSIS — O480 Post-term pregnancy: Principal | ICD-10-CM | POA: Diagnosis present

## 2023-02-02 DIAGNOSIS — M2350 Chronic instability of knee, unspecified knee: Secondary | ICD-10-CM | POA: Diagnosis present

## 2023-02-02 DIAGNOSIS — M25562 Pain in left knee: Secondary | ICD-10-CM | POA: Diagnosis present

## 2023-02-02 DIAGNOSIS — Z23 Encounter for immunization: Secondary | ICD-10-CM

## 2023-02-02 DIAGNOSIS — F603 Borderline personality disorder: Secondary | ICD-10-CM | POA: Diagnosis present

## 2023-02-02 DIAGNOSIS — Z3490 Encounter for supervision of normal pregnancy, unspecified, unspecified trimester: Principal | ICD-10-CM

## 2023-02-02 DIAGNOSIS — Z87891 Personal history of nicotine dependence: Secondary | ICD-10-CM

## 2023-02-02 DIAGNOSIS — Z5900 Homelessness unspecified: Secondary | ICD-10-CM

## 2023-02-02 DIAGNOSIS — O0933 Supervision of pregnancy with insufficient antenatal care, third trimester: Secondary | ICD-10-CM

## 2023-02-02 DIAGNOSIS — M25362 Other instability, left knee: Secondary | ICD-10-CM

## 2023-02-02 DIAGNOSIS — O99344 Other mental disorders complicating childbirth: Secondary | ICD-10-CM | POA: Diagnosis present

## 2023-02-02 DIAGNOSIS — Z3A36 36 weeks gestation of pregnancy: Secondary | ICD-10-CM

## 2023-02-02 DIAGNOSIS — F4310 Post-traumatic stress disorder, unspecified: Secondary | ICD-10-CM | POA: Diagnosis present

## 2023-02-02 DIAGNOSIS — Z3A41 41 weeks gestation of pregnancy: Secondary | ICD-10-CM

## 2023-02-02 DIAGNOSIS — O99892 Other specified diseases and conditions complicating childbirth: Secondary | ICD-10-CM | POA: Diagnosis present

## 2023-02-02 DIAGNOSIS — Z91410 Personal history of adult physical and sexual abuse: Secondary | ICD-10-CM | POA: Insufficient documentation

## 2023-02-02 HISTORY — DX: Attention-deficit hyperactivity disorder, unspecified type: F90.9

## 2023-02-02 LAB — URINALYSIS, MICROSCOPIC (REFLEX)

## 2023-02-02 LAB — DIFFERENTIAL
Abs Immature Granulocytes: 0.02 10*3/uL (ref 0.00–0.07)
Basophils Absolute: 0 10*3/uL (ref 0.0–0.1)
Basophils Relative: 1 %
Eosinophils Absolute: 0.1 10*3/uL (ref 0.0–0.5)
Eosinophils Relative: 1 %
Immature Granulocytes: 0 %
Lymphocytes Relative: 29 %
Lymphs Abs: 1.8 10*3/uL (ref 0.7–4.0)
Monocytes Absolute: 0.7 10*3/uL (ref 0.1–1.0)
Monocytes Relative: 12 %
Neutro Abs: 3.7 10*3/uL (ref 1.7–7.7)
Neutrophils Relative %: 57 %

## 2023-02-02 LAB — COMPREHENSIVE METABOLIC PANEL
ALT: 17 U/L (ref 0–44)
AST: 18 U/L (ref 15–41)
Albumin: 2.5 g/dL — ABNORMAL LOW (ref 3.5–5.0)
Alkaline Phosphatase: 192 U/L — ABNORMAL HIGH (ref 38–126)
Anion gap: 11 (ref 5–15)
BUN: 11 mg/dL (ref 6–20)
CO2: 20 mmol/L — ABNORMAL LOW (ref 22–32)
Calcium: 8.9 mg/dL (ref 8.9–10.3)
Chloride: 106 mmol/L (ref 98–111)
Creatinine, Ser: 0.6 mg/dL (ref 0.44–1.00)
GFR, Estimated: >60 mL/min (ref >60)
Glucose, Bld: 90 mg/dL (ref 70–99)
Potassium: 4 mmol/L (ref 3.5–5.1)
Sodium: 137 mmol/L (ref 135–145)
Total Bilirubin: 0.4 mg/dL (ref 0.3–1.2)
Total Protein: 5.6 g/dL — ABNORMAL LOW (ref 6.5–8.1)

## 2023-02-02 LAB — CBC
HCT: 30.1 % — ABNORMAL LOW (ref 36.0–46.0)
Hemoglobin: 9.8 g/dL — ABNORMAL LOW (ref 12.0–15.0)
MCH: 31.2 pg (ref 26.0–34.0)
MCHC: 32.6 g/dL (ref 30.0–36.0)
MCV: 95.9 fL (ref 80.0–100.0)
Platelets: 183 10*3/uL (ref 150–400)
RBC: 3.14 MIL/uL — ABNORMAL LOW (ref 3.87–5.11)
RDW: 12.9 % (ref 11.5–15.5)
WBC: 6.3 10*3/uL (ref 4.0–10.5)
nRBC: 0 % (ref 0.0–0.2)

## 2023-02-02 LAB — URINALYSIS, ROUTINE W REFLEX MICROSCOPIC
Bilirubin Urine: NEGATIVE
Glucose, UA: NEGATIVE mg/dL
Hgb urine dipstick: NEGATIVE
Ketones, ur: NEGATIVE mg/dL
Nitrite: NEGATIVE
Protein, ur: NEGATIVE mg/dL
Specific Gravity, Urine: >1.03 — ABNORMAL HIGH (ref 1.005–1.030)
pH: 6 (ref 5.0–8.0)

## 2023-02-02 LAB — HIV ANTIBODY (ROUTINE TESTING W REFLEX): HIV Screen 4th Generation wRfx: NONREACTIVE

## 2023-02-02 LAB — HEPATITIS C ANTIBODY: HCV Ab: NONREACTIVE

## 2023-02-02 LAB — RAPID URINE DRUG SCREEN, HOSP PERFORMED
Amphetamines: NOT DETECTED
Barbiturates: NOT DETECTED
Benzodiazepines: NOT DETECTED
Cocaine: NOT DETECTED
Opiates: NOT DETECTED
Tetrahydrocannabinol: POSITIVE — AB

## 2023-02-02 LAB — WET PREP, GENITAL
Clue Cells Wet Prep HPF POC: NONE SEEN
Sperm: NONE SEEN
WBC, Wet Prep HPF POC: >=10 — AB (ref <10)

## 2023-02-02 LAB — TYPE AND SCREEN
ABO/RH(D): O POS
Antibody Screen: NEGATIVE

## 2023-02-02 LAB — HEPATITIS B SURFACE ANTIGEN: Hepatitis B Surface Ag: NONREACTIVE

## 2023-02-02 MED ORDER — METRONIDAZOLE 500 MG PO TABS
2000.0000 mg | ORAL_TABLET | Freq: Once | ORAL | Status: AC
  Administered 2023-02-03: 2000 mg via ORAL
  Filled 2023-02-02: qty 4

## 2023-02-02 MED ORDER — LACTATED RINGERS IV SOLN: 500.0000 mL | INTRAVENOUS | Status: DC | PRN

## 2023-02-02 MED ORDER — LIDOCAINE HCL (PF) 1 % IJ SOLN: 30.0000 mL | INTRAMUSCULAR | Status: DC | PRN

## 2023-02-02 MED ORDER — LACTATED RINGERS IV SOLN: INTRAVENOUS | Status: DC

## 2023-02-02 MED ORDER — OXYTOCIN BOLUS FROM INFUSION
333.0000 mL | Freq: Once | INTRAVENOUS | Status: AC
  Administered 2023-02-03: 333 mL via INTRAVENOUS

## 2023-02-02 MED ORDER — FENTANYL CITRATE (PF) 100 MCG/2ML IJ SOLN: 50.0000 ug | INTRAMUSCULAR | Status: DC | PRN

## 2023-02-02 MED ORDER — SOD CITRATE-CITRIC ACID 500-334 MG/5ML PO SOLN: 30.0000 mL | ORAL | Status: DC | PRN

## 2023-02-02 MED ORDER — OXYTOCIN-SODIUM CHLORIDE 30-0.9 UT/500ML-% IV SOLN
2.5000 [IU]/h | INTRAVENOUS | Status: DC
  Administered 2023-02-03: 2.5 [IU]/h via INTRAVENOUS
  Filled 2023-02-02: qty 500

## 2023-02-02 MED ORDER — OXYCODONE-ACETAMINOPHEN 5-325 MG PO TABS: 1.0000 | ORAL_TABLET | ORAL | Status: DC | PRN

## 2023-02-02 MED ORDER — ACETAMINOPHEN 325 MG PO TABS: 650.0000 mg | ORAL_TABLET | ORAL | Status: DC | PRN

## 2023-02-02 MED ORDER — OXYCODONE-ACETAMINOPHEN 5-325 MG PO TABS: 2.0000 | ORAL_TABLET | ORAL | Status: DC | PRN

## 2023-02-02 MED ORDER — ONDANSETRON HCL 4 MG/2ML IJ SOLN: 4.0000 mg | Freq: Four times a day (QID) | INTRAMUSCULAR | Status: DC | PRN

## 2023-02-02 NOTE — Progress Notes (Signed)
Dr. Lynelle Doctor has talked with Dr. Despina Hidden, the [facility] attending at [facility]. The pt is to be transferred to MAU there for further evaluation and ultrasound.

## 2023-02-02 NOTE — MAU Provider Note (Signed)
History     CSN: [id number]  Arrival date and time: 02/02/23 1747   Event Date/Time   First Provider Initiated Contact with Patient 02/02/23 2029      Chief Complaint  Patient presents with   Wants Prenatal Care   HPI  Gloria Heath is a 37 y.o. G3P1001 at approximately [redacted] weeks pregnant who presents for evaluation of the pregnancy. Patient reports she knew it was time to deliver so she came in for evaluation. She states she is homeless and has been unable to get prenatal care. She states she felt regular fetal movement and was not worried about the baby. She denies any pain.   She denies any vaginal bleeding, discharge, and leaking of fluid. Denies any constipation, diarrhea or any urinary complaints. Reports normal fetal movement.   OB History     Gravida  4   Para  1   Term  1   Preterm      AB  2   Living  1      SAB  2   IAB  0   Ectopic      Multiple      Live Births  1           Past Medical History:  Diagnosis Date   ADHD    Anxiety    Depression    HPV (human papilloma virus) infection 2006   cleared on it's own, no treatment.     Past Surgical History:  Procedure Laterality Date   NO PAST SURGERIES      Family History  Problem Relation Age of Onset   Polycystic ovary syndrome Sister    Polycystic ovary syndrome Maternal Grandmother    Cancer Maternal Grandmother        lung   Cancer Paternal Grandfather        lung    Social History   Tobacco Use   Smoking status: Former    Packs/day: 0    Types: Cigarettes   Smokeless tobacco: Never  Vaping Use   Vaping Use: Every day  Substance Use Topics   Alcohol use: Not Currently    Alcohol/week: 4.0 standard drinks of alcohol    Types: 2 Glasses of wine, 2 Cans of beer per week    Comment: social   Drug use: Yes    Types: Marijuana    Comment: last used a few weeks ago    Allergies: No Known Allergies  Medications Prior to Admission  Medication Sig Dispense Refill Last  Dose   QUEtiapine (SEROQUEL) 25 MG tablet Take 1 tablet (25 mg total) by mouth 2 (two) times daily. 60 tablet 0 More than a month    Review of Systems  Constitutional: Negative.  Negative for fatigue and fever.  HENT: Negative.    Respiratory: Negative.  Negative for shortness of breath.   Cardiovascular: Negative.  Negative for chest pain.  Gastrointestinal: Negative.  Negative for abdominal pain, constipation, diarrhea, nausea and vomiting.  Genitourinary: Negative.  Negative for dysuria, vaginal bleeding and vaginal discharge.  Neurological: Negative.  Negative for dizziness and headaches.   Physical Exam   Blood pressure 131/81, pulse 78, temperature 98.6 F (37 C), temperature source Oral, resp. rate 17, height 5\' 5"  (1.651 m), weight 70.3 kg, last menstrual period 04/21/2022, SpO2 97 %.  Patient Vitals for the past 24 hrs:  BP Temp Temp src Pulse Resp SpO2 Height Weight  02/02/23 2308 131/81 98.6 F (37 C) Oral -- 17 -- -- --  02/02/23 2134 128/74 -- -- 78 -- 97 % -- --  02/02/23 2024 132/71 98.1 F (36.7 C) -- 85 18 -- -- --  02/02/23 1955 -- -- -- 84 -- 99 % -- --  02/02/23 1951 129/80 -- -- 86 -- 99 % -- --  02/02/23 1944 -- -- -- 83 -- 100 % -- --  02/02/23 1930 118/76 -- -- 79 19 -- -- --  02/02/23 1830 119/87 -- -- 88 -- 97 % -- --  02/02/23 1800 123/85 -- -- (!) 106 -- 96 % -- --  02/02/23 1758 -- -- -- -- -- -- 5\' 5"  (1.651 m) 70.3 kg  02/02/23 1757 (!) 125/90 -- -- (!) 102 -- 96 % -- --  02/02/23 1756 (!) 125/90 97.9 F (36.6 C) Oral 100 16 100 % -- --    Physical Exam Vitals and nursing note reviewed.  Constitutional:      General: She is not in acute distress.    Appearance: She is well-developed.  HENT:     Head: Normocephalic.  Eyes:     Pupils: Pupils are equal, round, and reactive to light.  Cardiovascular:     Rate and Rhythm: Normal rate and regular rhythm.     Heart sounds: Normal heart sounds.  Pulmonary:     Effort: Pulmonary effort is  normal. No respiratory distress.     Breath sounds: Normal breath sounds.  Abdominal:     General: Bowel sounds are normal. There is no distension.     Palpations: Abdomen is soft.     Tenderness: There is no abdominal tenderness.     Comments: FH: 40cm  Skin:    General: Skin is warm and dry.  Neurological:     Mental Status: She is alert and oriented to person, place, and time.  Psychiatric:        Mood and Affect: Mood normal.        Behavior: Behavior normal.        Thought Content: Thought content normal.        Judgment: Judgment normal.     Fetal Tracing:  Baseline: 140 Variability: moderate Accels: 15x15 Decels: none  Toco: 4-10  MAU Course  Procedures  Results for orders placed or performed during the hospital encounter of 02/02/23 (from the past 24 hour(s))  Urinalysis, Routine w reflex microscopic -Urine, Clean Catch     Status: Abnormal   Collection Time: 02/02/23  8:29 PM  Result Value Ref Range   Color, Urine YELLOW YELLOW   APPearance CLOUDY (A) CLEAR   Specific Gravity, Urine >1.030 (H) 1.005 - 1.030   pH 6.0 5.0 - 8.0   Glucose, UA NEGATIVE NEGATIVE mg/dL   Hgb urine dipstick NEGATIVE NEGATIVE   Bilirubin Urine NEGATIVE NEGATIVE   Ketones, ur NEGATIVE NEGATIVE mg/dL   Protein, ur NEGATIVE NEGATIVE mg/dL   Nitrite NEGATIVE NEGATIVE   Leukocytes,Ua MODERATE (A) NEGATIVE  Rapid urine drug screen (hospital performed)     Status: Abnormal   Collection Time: 02/02/23  8:29 PM  Result Value Ref Range   Opiates NONE DETECTED NONE DETECTED   Cocaine NONE DETECTED NONE DETECTED   Benzodiazepines NONE DETECTED NONE DETECTED   Amphetamines NONE DETECTED NONE DETECTED   Tetrahydrocannabinol POSITIVE (A) NONE DETECTED   Barbiturates NONE DETECTED NONE DETECTED  Urinalysis, Microscopic (reflex)     Status: Abnormal   Collection Time: 02/02/23  8:29 PM  Result Value Ref Range   RBC / HPF 0-5 0 -  5 RBC/hpf   WBC, UA 11-20 0 - 5 WBC/hpf   Bacteria, UA MANY  (A) NONE SEEN   Squamous Epithelial / HPF 11-20 0 - 5 /HPF   Budding Yeast PRESENT    Urine-Other LESS THAN 10 mL OF URINE SUBMITTED   Hepatitis B surface antigen     Status: None   Collection Time: 02/02/23  8:43 PM  Result Value Ref Range   Hepatitis B Surface Ag NON REACTIVE NON REACTIVE  CBC     Status: Abnormal   Collection Time: 02/02/23  8:43 PM  Result Value Ref Range   WBC 6.3 4.0 - 10.5 K/uL   RBC 3.14 (L) 3.87 - 5.11 MIL/uL   Hemoglobin 9.8 (L) 12.0 - 15.0 g/dL   HCT 16.1 (L) 09.6 - 04.5 %   MCV 95.9 80.0 - 100.0 fL   MCH 31.2 26.0 - 34.0 pg   MCHC 32.6 30.0 - 36.0 g/dL   RDW 40.9 81.1 - 91.4 %   Platelets 183 150 - 400 K/uL   nRBC 0.0 0.0 - 0.2 %  Differential     Status: None   Collection Time: 02/02/23  8:43 PM  Result Value Ref Range   Neutrophils Relative % 57 %   Neutro Abs 3.7 1.7 - 7.7 K/uL   Lymphocytes Relative 29 %   Lymphs Abs 1.8 0.7 - 4.0 K/uL   Monocytes Relative 12 %   Monocytes Absolute 0.7 0.1 - 1.0 K/uL   Eosinophils Relative 1 %   Eosinophils Absolute 0.1 0.0 - 0.5 K/uL   Basophils Relative 1 %   Basophils Absolute 0.0 0.0 - 0.1 K/uL   Immature Granulocytes 0 %   Abs Immature Granulocytes 0.02 0.00 - 0.07 K/uL  HIV Antibody (routine testing w rflx)     Status: None   Collection Time: 02/02/23  8:43 PM  Result Value Ref Range   HIV Screen 4th Generation wRfx Non Reactive Non Reactive  Type and screen [facility]     Status: None   Collection Time: 02/02/23  8:43 PM  Result Value Ref Range   ABO/RH(D) O POS    Antibody Screen NEG    Sample Expiration      02/05/2023,2359 Performed at [facility], [address] [place], [place] [address]   Hepatitis C antibody     Status: None   Collection Time: 02/02/23  8:43 PM  Result Value Ref Range   HCV Ab NON REACTIVE NON REACTIVE  Comprehensive metabolic panel     Status: Abnormal   Collection Time: 02/02/23  8:43 PM  Result Value Ref Range   Sodium 137 135 - 145  mmol/L   Potassium 4.0 3.5 - 5.1 mmol/L   Chloride 106 98 - 111 mmol/L   CO2 20 (L) 22 - 32 mmol/L   Glucose, Bld 90 70 - 99 mg/dL   BUN 11 6 - 20 mg/dL   Creatinine, Ser 6.21 0.44 - 1.00 mg/dL   Calcium 8.9 8.9 - 30.8 mg/dL   Total Protein 5.6 (L) 6.5 - 8.1 g/dL   Albumin 2.5 (L) 3.5 - 5.0 g/dL   AST 18 15 - 41 U/L   ALT 17 0 - 44 U/L   Alkaline Phosphatase 192 (H) 38 - 126 U/L   Total Bilirubin 0.4 0.3 - 1.2 mg/dL   GFR, Estimated >65 >78 mL/min   Anion gap 11 5 - 15  Wet prep, genital     Status: Abnormal  Collection Time: 02/02/23 10:02 PM   Specimen: Vaginal  Result Value Ref Range   Yeast Wet Prep HPF POC PRESENT (A) NONE SEEN   Trich, Wet Prep PRESENT (A) NONE SEEN   Clue Cells Wet Prep HPF POC NONE SEEN NONE SEEN   WBC, Wet Prep HPF POC >=10 (A) <10   Sperm NONE SEEN          MDM Labs ordered and reviewed.   OB Panel Wet prep and gc/chlamydia GBS PCR [place] MFM OB Comp +14 weeks  CNM consulted with Dr. Shawnie Pons regarding presentation and results- MD recommends admission for IOL  Assessment and Plan   -[redacted] weeks gestation -No prenatal care -Elevated BP in pregnancy  -Admit to labor and delivery -Report called to labor team  Rolm Bookbinder, CNM 02/03/2023, 8:30 PM

## 2023-02-02 NOTE — Progress Notes (Signed)
1922: OBRRN at bedside, report received from Hazel Sams, RN.  [phone]: Pt removed from monitor to go to bathroom.  1944: Monitoring reapplied.  1951: [facility] at bedside.  1955: Pt monitor d/c for transport to MAU.

## 2023-02-02 NOTE — ED Notes (Signed)
[facility] called. 

## 2023-02-02 NOTE — MAU Note (Signed)
[facility] from [facility]. Pt is homeless and Family services suggested to go to the hospital so she could get some resources and some history so they can help. Reports occasional ct. Good fetal movement reported denies any vag bleeding or leaking.

## 2023-02-02 NOTE — ED Provider Notes (Signed)
[place] EMERGENCY DEPARTMENT AT [facility] Provider Note   CSN: [id number] Arrival date & time: 02/02/23  1747     History  Chief Complaint  Patient presents with   Wants Prenatal Care    Gloria Heath is a 37 y.o. female.  HPI   Patient believes this is her fourth pregnancy.  She has 1 living child.  Patient did have a positive pregnancy test back in September of last year.  She believes she is at about 41 weeks.  Patient states she has not received any prenatal care.  [facility] recommended she come to the hospital to establish prenatal care.  Patient denies any complaints right now.  She is not having any abdominal pain.  She does not have any vaginal bleeding or leakage of fluid.  Patient has been feeling the baby move around.  Patient does not feel like she is having any contractions or is in labor  Home Medications Prior to Admission medications   Medication Sig Start Date End Date Taking? Authorizing Provider  QUEtiapine (SEROQUEL) 25 MG tablet Take 1 tablet (25 mg total) by mouth 2 (two) times daily. 09/11/21   Maryagnes Amos, FNP      Allergies    Patient has no known allergies.    Review of Systems   Review of Systems  Physical Exam Updated Vital Signs BP (!) 125/90 (BP Location: Left Arm)   Pulse 100   Temp 97.9 F (36.6 C) (Oral)   Resp 16   Ht 1.651 m (5\' 5" )   Wt 70.3 kg   SpO2 100%   BMI 25.79 kg/m  Physical Exam Vitals and nursing note reviewed.  Constitutional:      General: She is not in acute distress.    Appearance: She is well-developed.  HENT:     Head: Normocephalic and atraumatic.     Right Ear: External ear normal.     Left Ear: External ear normal.  Eyes:     General: No scleral icterus.       Right eye: No discharge.        Left eye: No discharge.     Conjunctiva/sclera: Conjunctivae normal.  Neck:     Trachea: No tracheal deviation.  Cardiovascular:     Rate and Rhythm:  Normal rate.  Pulmonary:     Effort: Pulmonary effort is normal. No respiratory distress.     Breath sounds: No stridor.  Abdominal:     General: There is distension.     Tenderness: There is no abdominal tenderness.  Genitourinary:    Comments: Gravid uterus Musculoskeletal:        General: No swelling or deformity.     Cervical back: Neck supple.  Skin:    General: Skin is warm and dry.     Findings: No rash.  Neurological:     Mental Status: She is alert. Mental status is at baseline.     Cranial Nerves: No dysarthria or facial asymmetry.     Motor: No seizure activity.     ED Results / Procedures / Treatments   Labs (all labs ordered are listed, but only abnormal results are displayed) Labs Reviewed - No data to display  EKG None  Radiology No results found.  Procedures Procedures    Medications Ordered in ED Medications - No data to display  ED Course/ Medical Decision Making/ A&P Clinical Course as of 02/02/23 1826  Mon Feb 02, 2023  1809 hCG was positive at 47 on September 18 of last year, patient estimated to be approximately 36 to 40 weeks [JK]    Clinical Course User Index [JK] Linwood Dibbles, MD                             Medical Decision Making Problems Addressed: Pregnancy, unspecified gestational age: undiagnosed new problem with uncertain prognosis  Amount and/or Complexity of Data Reviewed Discussion of management or test interpretation with external provider(s): Case discussed with Dr Despina Hidden  Risk Diagnosis or treatment significantly limited by social determinants of health.   Limited bedside ultrasound performed.  Active fetal movements, fetal cardiac activity present.  Patient presents to the ED to establish prenatal care.  Patient denies any complaints.  [facility] was notified and has evaluated the patient.  Patient is homeless and has not had any prenatal care.  She does not appear to be in active labor.  I discussed the case with Dr. Despina Hidden  OB/GYN at [facility].  We will transfer the patient to the maternity admissions unit so she can have her initial screening evaluation and ultrasound considering her late stage of pregnancy.        Final Clinical Impression(s) / ED Diagnoses Final diagnoses:  Pregnancy, unspecified gestational age    Rx / DC Orders ED Discharge Orders     None         Linwood Dibbles, MD 02/02/23 1827

## 2023-02-02 NOTE — Progress Notes (Signed)
The pt says she has walked here from the [facility]. She says she has been homeless for the past year. She says this is her 3rd pregnancy and she has a 39 year old daughter that lives with her husband. Pt says she has not had any prenatal care. She denies vaginal bleeding , cramping, or leaking of fluid.

## 2023-02-02 NOTE — Progress Notes (Signed)
Report given to MAU RN Haywood Lasso.

## 2023-02-02 NOTE — ED Triage Notes (Addendum)
Patient walked 5 [facility]. This is patients 3rd time being pregnant, has 1 living baby and is currently [redacted] weeks pregnant. Has not received any prenatal care. Stated she was told to come to the ED for prenatal care. Denies pain. No vaginal leakage. Denies contractions.
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
## 2023-02-03 ENCOUNTER — Inpatient Hospital Stay (HOSPITAL_COMMUNITY): Payer: MEDICAID | Admitting: Anesthesiology

## 2023-02-03 ENCOUNTER — Encounter (HOSPITAL_COMMUNITY): Payer: Self-pay | Admitting: Family Medicine

## 2023-02-03 ENCOUNTER — Inpatient Hospital Stay (HOSPITAL_COMMUNITY): Payer: MEDICAID

## 2023-02-03 DIAGNOSIS — O480 Post-term pregnancy: Secondary | ICD-10-CM

## 2023-02-03 DIAGNOSIS — O99344 Other mental disorders complicating childbirth: Secondary | ICD-10-CM

## 2023-02-03 DIAGNOSIS — O09523 Supervision of elderly multigravida, third trimester: Secondary | ICD-10-CM

## 2023-02-03 DIAGNOSIS — Z3A41 41 weeks gestation of pregnancy: Secondary | ICD-10-CM

## 2023-02-03 DIAGNOSIS — Z91410 Personal history of adult physical and sexual abuse: Secondary | ICD-10-CM | POA: Insufficient documentation

## 2023-02-03 LAB — RPR: RPR Ser Ql: NONREACTIVE

## 2023-02-03 LAB — GC/CHLAMYDIA PROBE AMP (~~LOC~~) NOT AT ARMC
Chlamydia: NEGATIVE
Comment: NEGATIVE
Comment: NORMAL
Neisseria Gonorrhea: NEGATIVE

## 2023-02-03 LAB — ABO/RH: ABO/RH(D): O POS

## 2023-02-03 LAB — CULTURE, BETA STREP (GROUP B ONLY)

## 2023-02-03 MED ORDER — FLUCONAZOLE 150 MG PO TABS
150.0000 mg | ORAL_TABLET | ORAL | Status: DC
  Administered 2023-02-03: 150 mg via ORAL
  Filled 2023-02-03: qty 1

## 2023-02-03 MED ORDER — ONDANSETRON HCL 4 MG/2ML IJ SOLN: 4.0000 mg | INTRAMUSCULAR | Status: DC | PRN

## 2023-02-03 MED ORDER — LIDOCAINE HCL (PF) 1 % IJ SOLN: INTRAMUSCULAR | Status: DC | PRN

## 2023-02-03 MED ORDER — TERBUTALINE SULFATE 1 MG/ML IJ SOLN: 0.2500 mg | Freq: Once | INTRAMUSCULAR | Status: DC | PRN

## 2023-02-03 MED ORDER — COCONUT OIL OIL: 1.0000 | TOPICAL_OIL | Status: DC | PRN

## 2023-02-03 MED ORDER — FENTANYL-BUPIVACAINE-NACL 0.5-0.125-0.9 MG/250ML-% EP SOLN
12.0000 mL/h | EPIDURAL | Status: DC | PRN
  Filled 2023-02-03: qty 250

## 2023-02-03 MED ORDER — DIPHENHYDRAMINE HCL 25 MG PO CAPS: 25.0000 mg | ORAL_CAPSULE | Freq: Four times a day (QID) | ORAL | Status: DC | PRN

## 2023-02-03 MED ORDER — TETANUS-DIPHTH-ACELL PERTUSSIS 5-2.5-18.5 LF-MCG/0.5 IM SUSY
0.5000 mL | PREFILLED_SYRINGE | Freq: Once | INTRAMUSCULAR | Status: AC
  Administered 2023-02-05: 0.5 mL via INTRAMUSCULAR
  Filled 2023-02-03: qty 0.5

## 2023-02-03 MED ORDER — EPHEDRINE 5 MG/ML INJ: 10.0000 mg | INTRAVENOUS | Status: DC | PRN

## 2023-02-03 MED ORDER — SODIUM CHLORIDE 0.9% FLUSH: 3.0000 mL | INTRAVENOUS | Status: DC | PRN

## 2023-02-03 MED ORDER — ONDANSETRON HCL 4 MG PO TABS: 4.0000 mg | ORAL_TABLET | ORAL | Status: DC | PRN

## 2023-02-03 MED ORDER — SODIUM CHLORIDE 0.9 % IV SOLN: 250.0000 mL | INTRAVENOUS | Status: DC | PRN

## 2023-02-03 MED ORDER — PHENYLEPHRINE 80 MCG/ML (10ML) SYRINGE FOR IV PUSH (FOR BLOOD PRESSURE SUPPORT): 80.0000 ug | PREFILLED_SYRINGE | INTRAVENOUS | Status: DC | PRN

## 2023-02-03 MED ORDER — LACTATED RINGERS IV SOLN: 500.0000 mL | Freq: Once | INTRAVENOUS | Status: DC

## 2023-02-03 MED ORDER — ACETAMINOPHEN 325 MG PO TABS
650.0000 mg | ORAL_TABLET | ORAL | Status: DC | PRN
  Administered 2023-02-04 – 2023-02-05 (×2): 650 mg via ORAL
  Filled 2023-02-03 (×2): qty 2

## 2023-02-03 MED ORDER — ZOLPIDEM TARTRATE 5 MG PO TABS: 5.0000 mg | ORAL_TABLET | Freq: Every evening | ORAL | Status: DC | PRN

## 2023-02-03 MED ORDER — OXYTOCIN-SODIUM CHLORIDE 30-0.9 UT/500ML-% IV SOLN: 1.0000 m[IU]/min | INTRAVENOUS | Status: DC

## 2023-02-03 MED ORDER — PRENATAL MULTIVITAMIN CH
1.0000 | ORAL_TABLET | Freq: Every day | ORAL | Status: DC
  Administered 2023-02-03 – 2023-02-05 (×3): 1 via ORAL
  Filled 2023-02-03 (×3): qty 1

## 2023-02-03 MED ORDER — SODIUM CHLORIDE 0.9% FLUSH
3.0000 mL | Freq: Two times a day (BID) | INTRAVENOUS | Status: DC
  Administered 2023-02-03 – 2023-02-04 (×2): 3 mL via INTRAVENOUS

## 2023-02-03 MED ORDER — TRANEXAMIC ACID-NACL 1000-0.7 MG/100ML-% IV SOLN
INTRAVENOUS | Status: AC
  Filled 2023-02-03: qty 100

## 2023-02-03 MED ORDER — BUPIVACAINE HCL (PF) 0.25 % IJ SOLN
INTRAMUSCULAR | Status: DC | PRN
  Administered 2023-02-03: 1.2 mL via INTRATHECAL

## 2023-02-03 MED ORDER — SENNOSIDES-DOCUSATE SODIUM 8.6-50 MG PO TABS
2.0000 | ORAL_TABLET | ORAL | Status: DC
  Administered 2023-02-03 – 2023-02-05 (×3): 2 via ORAL
  Filled 2023-02-03 (×3): qty 2

## 2023-02-03 MED ORDER — IBUPROFEN 600 MG PO TABS
600.0000 mg | ORAL_TABLET | Freq: Four times a day (QID) | ORAL | Status: DC
  Administered 2023-02-03 – 2023-02-05 (×10): 600 mg via ORAL
  Filled 2023-02-03 (×10): qty 1

## 2023-02-03 MED ORDER — MEASLES, MUMPS & RUBELLA VAC IJ SOLR: 0.5000 mL | Freq: Once | INTRAMUSCULAR | Status: DC

## 2023-02-03 MED ORDER — SIMETHICONE 80 MG PO CHEW: 80.0000 mg | CHEWABLE_TABLET | ORAL | Status: DC | PRN

## 2023-02-03 MED ORDER — DIBUCAINE (PERIANAL) 1 % EX OINT: 1.0000 | TOPICAL_OINTMENT | CUTANEOUS | Status: DC | PRN

## 2023-02-03 MED ORDER — BENZOCAINE-MENTHOL 20-0.5 % EX AERO: 1.0000 | INHALATION_SPRAY | CUTANEOUS | Status: DC | PRN

## 2023-02-03 MED ORDER — TRANEXAMIC ACID-NACL 1000-0.7 MG/100ML-% IV SOLN
1000.0000 mg | INTRAVENOUS | Status: AC
  Administered 2023-02-03: 1000 mg via INTRAVENOUS

## 2023-02-03 MED ORDER — FENTANYL-BUPIVACAINE-NACL 0.5-0.125-0.9 MG/250ML-% EP SOLN
EPIDURAL | Status: DC | PRN
  Administered 2023-02-03: 12 mL/h via EPIDURAL

## 2023-02-03 MED ORDER — MISOPROSTOL 25 MCG QUARTER TABLET
25.0000 ug | ORAL_TABLET | ORAL | Status: DC | PRN
  Administered 2023-02-03: 25 ug via VAGINAL
  Filled 2023-02-03: qty 1

## 2023-02-03 MED ORDER — WITCH HAZEL-GLYCERIN EX PADS: 1.0000 | MEDICATED_PAD | CUTANEOUS | Status: DC | PRN

## 2023-02-03 MED ORDER — DIPHENHYDRAMINE HCL 50 MG/ML IJ SOLN: 12.5000 mg | INTRAMUSCULAR | Status: DC | PRN

## 2023-02-03 NOTE — Progress Notes (Signed)
Labor Progress Note  Barbarita Hutmacher is a 37 y.o. [phone] at [redacted]w[redacted]d presented for IOL PD  S: pt feeling her contractions and now requests epidural  O:  BP (!) 123/54   Pulse 61   Temp 98.8 F (37.1 C) (Oral)   Resp 16   Ht 5\' 5"  (1.651 m)   Wt 70.3 kg   LMP 04/21/2022   SpO2 97%   BMI 25.79 kg/m  EFM: 135 bpm/Moderate variability/ 15x15 accels/ None decels  CVE: Dilation: 4 Effacement (%): 50 Station: -3 Presentation: Vertex Exam by:: Patrcia Dolly MD   A&P: 37 y.o. [id number] [redacted]w[redacted]d  here for IOL as above  #Labor: Progressing well. Defer SVE until epidural placed per pt #Pain: Family/Friend support and Epidural #FWB: CAT 1 #GBS  pending- no risk factors for C/S  Myrtie Hawk, DO FMOB Fellow, Faculty practice [facility], [facility] for [facility] 02/03/23  7:36 AM

## 2023-02-03 NOTE — Progress Notes (Signed)
Called by RN about patient experiencing left knee pain and was wondering if someone can take a look at it.  I evaluated her. She reports jumping off the back of a camper, about 56ft high 1 month ago, and as she landed on the ground, felt a very sharp pain in her left knee, so sharp that she went down on the ground and took some time to get up. She has since had a lot of pain in her knee and is unable to bear weight without pain. She has also felt that her knee cap has been sliding of the knee joint very often, does not remember which direction it moves to. Has also experienced some swelling.  On examination,  She has mild diffuse swelling over left knee, possibly a mild effusion. Tender to palpation over her patella. No joint line tenderness. Patella apprehension is positive to the lateral side. Neg varus and valgus stress tests, negative anterior and posterior drawers tests, negative McMurray's.  Assessment:  Left knee pain, suspect patella dislocation, vs patellofemoral pain syndrome that has been triggered. Less likely ligament injury in knee joint, given stability.  Plan:  Xray knee. Knee brace Request physical therapy evaluation.  She will benefit from continuing PT outpatient and from a sports medicine follow up. I informed her of this.  She will work on getting signed up on medicaid, so she is able to follow up outpatient and get physical therapy completed.  Sheppard Evens MD MPH OB Fellow, Faculty Practice [facility], Center for [facility] 02/03/2023

## 2023-02-03 NOTE — Anesthesia Procedure Notes (Addendum)
Epidural Patient location during procedure: OB Start time: 02/03/2023 7:55 AM End time: 02/03/2023 8:05 AM  Staffing Anesthesiologist: Lannie Fields, DO Performed: anesthesiologist   Preanesthetic Checklist Completed: patient identified, IV checked, risks and benefits discussed, monitors and equipment checked, pre-op evaluation and timeout performed  Epidural Patient position: sitting Prep: DuraPrep and site prepped and draped Patient monitoring: continuous pulse ox, blood pressure, heart rate and cardiac monitor Approach: midline Location: L3-L4 Injection technique: LOR air  Needle:  Needle type: Tuohy  Needle gauge: 17 G Needle length: 9 cm Needle insertion depth: 6 cm Catheter type: closed end flexible Catheter size: 19 Gauge Catheter at skin depth: 11 cm Test dose: negative  Assessment Sensory level: T8 Events: blood not aspirated, no cerebrospinal fluid, injection not painful, no injection resistance, no paresthesia and negative IV test  Additional Notes Patient identified. Risks/Benefits/Options discussed with patient including but not limited to bleeding, infection, nerve damage, paralysis, failed block, incomplete pain control, headache, blood pressure changes, nausea, vomiting, reactions to medication both or allergic, itching and postpartum back pain. Confirmed with bedside nurse the patient's most recent platelet count. Confirmed with patient that they are not currently taking any anticoagulation, have any bleeding history or any family history of bleeding disorders. Patient expressed understanding and wished to proceed. All questions were answered. Sterile technique was used throughout the entire procedure. Please see nursing notes for vital signs. Test dose was given through epidural catheter and negative prior to continuing to dose epidural or start infusion. Warning signs of high block given to the patient including shortness of breath, tingling/numbness in  hands, complete motor block, or any concerning symptoms with instructions to call for help. Patient was given instructions on fall risk and not to get out of bed. All questions and concerns addressed with instructions to call with any issues or inadequate analgesia.    CSE with 24G sprotte through tuohy, clear CSF no issuesReason for block:procedure for pain
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
## 2023-02-03 NOTE — Progress Notes (Signed)
MD notified of a few moderately high BPs with no other s/s of preeclampsia. MD wants to watch overnight and change plan of care if needed.

## 2023-02-03 NOTE — Anesthesia Postprocedure Evaluation (Signed)
Anesthesia Post Note  Patient: Gloria Heath  Procedure(s) Performed: AN AD HOC LABOR EPIDURAL     Patient location during evaluation: Mother Baby Anesthesia Type: Epidural Level of consciousness: awake, awake and alert and oriented Pain management: pain level controlled Vital Signs Assessment: post-procedure vital signs reviewed and stable Respiratory status: spontaneous breathing and nonlabored ventilation Cardiovascular status: blood pressure returned to baseline and stable Postop Assessment: no headache, no backache, able to ambulate and adequate PO intake Anesthetic complications: no   No notable events documented.  Last Vitals:  Vitals:   02/03/23 1439 02/03/23 1810  BP: (!) 141/93 134/82  Pulse: (!) 57 70  Resp: 18 17  Temp: 36.8 C 36.8 C  SpO2: 100% 99%    Last Pain:  Vitals:   02/03/23 1810  TempSrc: Oral  PainSc:    Pain Goal:                   Gloria Heath

## 2023-02-03 NOTE — Anesthesia Preprocedure Evaluation (Signed)
Anesthesia Evaluation  Patient identified by MRN, date of birth, ID band Patient awake    Reviewed: Allergy & Precautions, Patient's Chart, lab work & pertinent test results  Airway Mallampati: II  TM Distance: >3 FB Neck ROM: Full    Dental no notable dental hx.    Pulmonary neg pulmonary ROS, former smoker   Pulmonary exam normal breath sounds clear to auscultation       Cardiovascular negative cardio ROS Normal cardiovascular exam Rhythm:Regular Rate:Normal     Neuro/Psych  PSYCHIATRIC DISORDERS Anxiety Depression    negative neurological ROS     GI/Hepatic negative GI ROS, Neg liver ROS,,,  Endo/Other  negative endocrine ROS    Renal/GU negative Renal ROS  negative genitourinary   Musculoskeletal negative musculoskeletal ROS (+)    Abdominal   Peds negative pediatric ROS (+)  Hematology  (+) Blood dyscrasia, anemia Hb 9.8, plt 183   Anesthesia Other Findings   Reproductive/Obstetrics (+) Pregnancy                             Anesthesia Physical Anesthesia Plan  ASA: 2  Anesthesia Plan: Combined Spinal and Epidural   Post-op Pain Management:    Induction:   PONV Risk Score and Plan: 2  Airway Management Planned: Natural Airway  Additional Equipment: None  Intra-op Plan:   Post-operative Plan:   Informed Consent: I have reviewed the patients History and Physical, chart, labs and discussed the procedure including the risks, benefits and alternatives for the proposed anesthesia with the patient or authorized representative who has indicated his/her understanding and acceptance.       Plan Discussed with:   Anesthesia Plan Comments: (Pt very uncomfortable upon arrival to room, RN cervical check still 5cm- will proceed w/ CSE)       Anesthesia Quick Evaluation
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
## 2023-02-04 LAB — CULTURE, BETA STREP (GROUP B ONLY)

## 2023-02-04 LAB — CULTURE, OB URINE: Culture: 5000 — AB

## 2023-02-04 LAB — CBC
HCT: 28.9 % — ABNORMAL LOW (ref 36.0–46.0)
Hemoglobin: 9.6 g/dL — ABNORMAL LOW (ref 12.0–15.0)
MCH: 31.6 pg (ref 26.0–34.0)
MCHC: 33.2 g/dL (ref 30.0–36.0)
MCV: 95.1 fL (ref 80.0–100.0)
Platelets: 141 10*3/uL — ABNORMAL LOW (ref 150–400)
RBC: 3.04 MIL/uL — ABNORMAL LOW (ref 3.87–5.11)
RDW: 12.8 % (ref 11.5–15.5)
WBC: 9 10*3/uL (ref 4.0–10.5)
nRBC: 0 % (ref 0.0–0.2)

## 2023-02-04 LAB — RUBELLA SCREEN: Rubella: 1.62 index (ref >0.99)

## 2023-02-04 LAB — COMPREHENSIVE METABOLIC PANEL
ALT: 14 U/L (ref 0–44)
AST: 20 U/L (ref 15–41)
Albumin: 2.2 g/dL — ABNORMAL LOW (ref 3.5–5.0)
Alkaline Phosphatase: 164 U/L — ABNORMAL HIGH (ref 38–126)
Anion gap: 5 (ref 5–15)
BUN: 7 mg/dL (ref 6–20)
CO2: 26 mmol/L (ref 22–32)
Calcium: 8.4 mg/dL — ABNORMAL LOW (ref 8.9–10.3)
Chloride: 104 mmol/L (ref 98–111)
Creatinine, Ser: 0.53 mg/dL (ref 0.44–1.00)
GFR, Estimated: >60 mL/min (ref >60)
Glucose, Bld: 91 mg/dL (ref 70–99)
Potassium: 3.6 mmol/L (ref 3.5–5.1)
Sodium: 135 mmol/L (ref 135–145)
Total Bilirubin: 0.3 mg/dL (ref 0.3–1.2)
Total Protein: 5.1 g/dL — ABNORMAL LOW (ref 6.5–8.1)

## 2023-02-04 MED ORDER — FUROSEMIDE 20 MG PO TABS
20.0000 mg | ORAL_TABLET | Freq: Every day | ORAL | Status: DC
  Administered 2023-02-04 – 2023-02-05 (×2): 20 mg via ORAL
  Filled 2023-02-04 (×2): qty 1

## 2023-02-04 MED ORDER — NIFEDIPINE ER OSMOTIC RELEASE 30 MG PO TB24
30.0000 mg | ORAL_TABLET | Freq: Every day | ORAL | Status: DC
  Administered 2023-02-04 – 2023-02-05 (×2): 30 mg via ORAL
  Filled 2023-02-04 (×2): qty 1
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
## 2023-02-04 NOTE — Clinical Social Work Maternal (Signed)
CLINICAL SOCIAL WORK MATERNAL/CHILD NOTE  Patient Details  Name: Gloria Heath MRN: [id number] Date of Birth: 08-06-1986  Date:  02/04/2023  Clinical Social Worker Initiating Note:  Willaim Rayas Kairen Hallinan Date/Time: Initiated:  02/04/23/1430     Child's Name:  Gloria Heath   Biological Parents:  Mother Gloria Heath 07/27/84)   Need for Interpreter:  None   Reason for Referral:  Late or No Prenatal Care  , Homelessness, Behavioral Health Concerns   Address:  [address] [place] [address]    Phone number:  [phone] (home)     Additional phone number: [phone]  Household Members/Support Persons (HM/SP):       HM/SP Name Relationship DOB or Age  HM/SP -1        HM/SP -2        HM/SP -3        HM/SP -4        HM/SP -5        HM/SP -6        HM/SP -7        HM/SP -8          Natural Supports (not living in the home):      Professional Supports: None   Employment: Unemployed   Type of Work:     Education:  Some Materials engineer arranged:    Surveyor, quantity Resources:      Other Resources:      Cultural/Religious Considerations Which May Impact Care:    Strengths:  Compliance with medical plan     Psychotropic Medications:         Pediatrician:       Pediatrician List:   [facility]      Pediatrician Fax Number:    Risk Factors/Current Problems:  Transportation  , Basic Needs     Cognitive State:  Able to Concentrate  , Alert     Mood/Affect:  Comfortable  , Interested     CSW Assessment: CSW received consult for no PNC, homelessness and Anxiety and depression. CSW met with MOB to complete assessment and offer support. CSW entered the room and observed MOB relaxing in bed and holding the infant. CSW introduced self, CSW role and reason for visit. MOB was agreeable to visit. CSW inquire about how MOB was feeling, MOB reported good just tired. CSW inquired about  MOB current living situation, MOB reported she has been living on the streets ans has no permanent housing, MOB reported she has looked into shelters but has not been able to locate housing. CSW provided MOB with a shelter list. And called [facility] (MOB must be pregnant to receive a bed), [facility](current wait list), and [facility].   CSW inquired abut MOB MH hx, MOB reported she has been diagnosed with depression. MOB reported she is currently off of her medication as she has not had transportation to get to appointments. MOB reported she is maintaining. CSW provided MOB with MH resources for [employer]. MOB denied any PPD with her first child. CSW provided education regarding the baby blues period vs. perinatal mood disorders, discussed treatment and gave resources for mental health follow up if concerns arise.  CSW recommends self-evaluation during the postpartum time period using the New Mom Checklist from Postpartum Progress and encouraged MOB to contact a medical professional if symptoms are noted at any time.  CSW inquired abut MOB lack of PNC MOB reported she was unable to get to appointments due to lack of transportation. CSW explained the hospital drug screen policy die to lack of PNC, MOB voiced understanding. CSW explained the infants UDS was positive for [facility] and the CDS was pending. CSW explained that a CPS report would be made due to the infants UDS and MOB lack of basic needs. MOB voiced understanding. CSW inquired about any CPS hx, MOB denied any CPS hx. CSW inquired about MOB's other child, MOB reported she lives with her father, MOB's Ex husband. CSW made a CPS report to [facility], case was screened in and assigned to SW [employer]. CSW spoke with SW Raynor and she place to visit MOB and the infant tomorrow 5/2/202. CSW provided review of Sudden Infant Death Syndrome (SIDS) precautions.  MOB reported she does not have necessary supplies for the infant. CSW informed  MOB the hospital could assist wit some needed items.   CSW Plan/Description:  Sudden Infant Death Syndrome (SIDS) Education, Perinatal Mood and Anxiety Disorder (PMADs) Education, Other Information/Referral to [facility], Hospital Drug Screen Policy Information, Child Therapist, nutritional Report  , CSW Awaiting CPS Disposition Plan, CSW Will Continue to Monitor Umbilical Cord Tissue Drug Screen Results and Make Report if Warranted, Psychosocial Support and Ongoing Assessment of Needs    Maud Deed, LCSW 02/04/2023, 2:39 PM

## 2023-02-04 NOTE — Progress Notes (Signed)
Orthopedic Tech Progress Note Patient Details:  Gloria Heath 10-10-1985 [id number]  Ortho Devices Type of Ortho Device: Knee Sleeve Ortho Device/Splint Location: LLE Ortho Device/Splint Interventions: Ordered, Application   Post Interventions Patient Tolerated: Well Instructions Provided: Care of device  Donald Pore 02/04/2023, 11:57 AM

## 2023-02-04 NOTE — Evaluation (Signed)
Physical Therapy Evaluation Patient Details Name: Gloria Heath MRN: [id number] DOB: 28-Dec-1985 Today's Date: 02/04/2023  History of Present Illness  Pt is a 37 y.o. female who presented 02/02/23 for delivery of her baby, which occurred 02/03/23. During hospital course, pt complained of L knee pain that began about a month ago after jumping off a camper ~8 ft high. Imaging negative for acute fracture or dislocation. PMH: ADHD, anxiety, depression, HPV   Clinical Impression  Pt presents with condition above and deficits mentioned below, see PT Problem List. PTA, she was independent and homeless. Pt was utilizing a cane intermittently to manage her new L knee pain. Currently, pt displays signs and symptoms consistent with L patellofemoral pain syndrome, with a positive McConnell Test, and potential lateral patellar dislocation PTA. Pt is able to ambulate slowly with an antalgic gait pattern without assistance though. Educated pt on stretches and exercises to address patellofemoral pain syndrome and ideally improve her knee stability and pain. Provided her with HEP handout. Recommend follow-up with OPPT to further address this issue and her deficits in L quads strength. Will continue to follow acutely.     Recommendations for follow up therapy are one component of a multi-disciplinary discharge planning process, led by the attending physician.  Recommendations may be updated based on patient status, additional functional criteria and insurance authorization.  Follow Up Recommendations       Assistance Recommended at Discharge None  Patient can return home with the following  Assist for transportation    Equipment Recommendations None recommended by PT  Recommendations for Other Services       Functional Status Assessment Patient has had a recent decline in their functional status and demonstrates the ability to make significant improvements in function in a reasonable and predictable amount of  time.     Precautions / Restrictions Precautions Precautions: Other (comment) Required Braces or Orthoses: Other Brace Other Brace: L knee sleeve Restrictions Weight Bearing Restrictions: No      Mobility  Bed Mobility Overal bed mobility: Modified Independent             General bed mobility comments: HOB elevated, no assistance needed    Transfers Overall transfer level: Modified independent Equipment used: None Transfers: Sit to/from Stand Sit to Stand: Modified independent (Device/Increase time)           General transfer comment: Slow to rise due to L knee pain but no LOB    Ambulation/Gait Ambulation/Gait assistance: Supervision, Modified independent (Device/Increase time) Gait Distance (Feet): 240 Feet Assistive device: None Gait Pattern/deviations: Step-through pattern, Decreased stride length, Knee flexed in stance - left, Trunk flexed, Antalgic Gait velocity: reduced Gait velocity interpretation: 1.31 - 2.62 ft/sec, indicative of limited community ambulator   General Gait Details: Pt with slow, antalgic gait pattern and L knee flexion in stance, but no LOB. Mild sway intermittently, supervision for safety but capable of being mod I  Careers information officer    Modified Rankin (Stroke Patients Only)       Balance Overall balance assessment: Mild deficits observed, not formally tested                                           Pertinent Vitals/Pain Pain Assessment Pain Assessment: Faces Faces Pain Scale: Hurts even more Pain Location: L knee Pain Descriptors /  Indicators: Discomfort, Grimacing, Guarding Pain Intervention(s): Limited activity within patient's tolerance, Monitored during session, Repositioned, Other (comment) (wore knee sleeve with gait)    Home Living Family/patient expects to be discharged to:: Shelter/Homeless                   Additional Comments: Pt currently going through  "messy" divorce and is homeless    Prior Function Prior Level of Function : Independent/Modified Independent             Mobility Comments: Used SPC intermittently with knee pain, otherwise independent       Hand Dominance        Extremity/Trunk Assessment   Upper Extremity Assessment Upper Extremity Assessment: Overall WFL for tasks assessed    Lower Extremity Assessment Lower Extremity Assessment: LLE deficits/detail LLE Deficits / Details: Negative Anterior Drawer Test, Posterior Drawer Test, McMurray's Test, Valgus Stress Test, Varus Stress Test, Posterior Sag Sign; Positive McConnell Test towards end extension ROM; anxiety with potential lateral translation of patella; noted weakness in quads and visible IT band line    Cervical / Trunk Assessment Cervical / Trunk Assessment: Normal  Communication   Communication: No difficulties  Cognition Arousal/Alertness: Awake/alert Behavior During Therapy: Anxious Overall Cognitive Status: Within Functional Limits for tasks assessed                                 General Comments: Pt understandably anxious in regards to dispo planning and concern over custody of her son.        General Comments General comments (skin integrity, edema, etc.): Provided education on exercises to address potential L patellofemoral pain syndrome, Med[employer] HEP Access Code: [email or web address]    Exercises     Assessment/Plan    PT Assessment Patient needs continued PT services  PT Problem List Decreased strength;Decreased activity tolerance;Decreased balance;Decreased mobility;Pain       PT Treatment Interventions DME instruction;Gait training;Stair training;Functional mobility training;Therapeutic activities;Balance training;Therapeutic exercise;Neuromuscular re-education;Patient/family education    PT Goals (Current goals can be found in the Care Plan section)  Acute Rehab PT Goals Patient Stated Goal: to find housing and keep her  baby PT Goal Formulation: With patient Time For Goal Achievement: 02/18/23 Potential to Achieve Goals: Good    Frequency Min 2X/week     Co-evaluation               AM-PAC PT "6 Clicks" Mobility  Outcome Measure Help needed turning from your back to your side while in a flat bed without using bedrails?: None Help needed moving from lying on your back to sitting on the side of a flat bed without using bedrails?: None Help needed moving to and from a bed to a chair (including a wheelchair)?: None Help needed standing up from a chair using your arms (e.g., wheelchair or bedside chair)?: None Help needed to walk in hospital room?: A Little Help needed climbing 3-5 steps with a railing? : A Little 6 Click Score: 22    End of Session   Activity Tolerance: Patient tolerated treatment well Patient left: in bed;with call bell/phone within reach Nurse Communication: Mobility status PT Visit Diagnosis: Unsteadiness on feet (R26.81);Other abnormalities of gait and mobility (R26.89);Muscle weakness (generalized) (M62.81);Difficulty in walking, not elsewhere classified (R26.2);Pain Pain - Right/Left: Left Pain - part of body: Knee    Time: 1425-1500 PT Time Calculation (min) (ACUTE ONLY): 35 min   Charges:   PT Evaluation $  PT Eval Low Complexity: 1 Low PT Treatments $Therapeutic Activity: 8-22 mins        Raymond Gurney, PT, DPT Acute Rehabilitation Services  Office: [phone]   Jewel Baize 02/04/2023, 4:26 PM

## 2023-02-04 NOTE — Lactation Note (Signed)
This note was copied from a baby's chart. Lactation Consultation Note  Patient Name: Gloria Heath [id number] Date: 02/04/2023 Age:39 hours Reason for consult: Initial assessment  P2, Baby was latched when [place] entered room.  Baby unlatched and LC guided baby deeper on breast.  Mother hand expressed strong flow from other breast.  Provided mother with manual pump.  Mother tearful during consult while continuing to feed baby.  Comforted mother. Recommend to feed on demand with cues.  Goal 8-12+ times per day after first 24 hrs.    Maternal Data Has patient been taught Hand Expression?: Yes Does the patient have breastfeeding experience prior to this delivery?: Yes How long did the patient breastfeed?: 18 mos.  Feeding Mother's Current Feeding Choice: Breast Milk  LATCH Score Latch: Grasps breast easily, tongue down, lips flanged, rhythmical sucking.  Audible Swallowing: A few with stimulation  Type of Nipple: Everted at rest and after stimulation  Comfort (Breast/Nipple): Soft / non-tender  Hold (Positioning): Assistance needed to correctly position infant at breast and maintain latch.  LATCH Score: 8   Lactation Tools Discussed/Used  Manual pump  Interventions Interventions: Breast feeding basics reviewed;Hand express;Hand pump;Education;LC Services brochure  Discharge Pump: Manual  Consult Status Consult Status: Follow-up Date: 02/05/23 Follow-up type: In-patient    Dahlia Byes [facility] 02/04/2023, 3:24 PM

## 2023-02-04 NOTE — Progress Notes (Addendum)
POSTPARTUM PROGRESS NOTE  Post Partum Day 1  Subjective:  Gloria Heath is a 37 y.o. W1X9147 s/p SVD at [redacted]w[redacted]d.  She reports she is doing well. No acute events overnight. She denies any problems with ambulating, voiding or po intake. Denies nausea or vomiting.  Pain is well controlled.  Lochia is appropriate .  Objective: Blood pressure 117/78, pulse 71, temperature 97.9 F (36.6 C), temperature source Oral, resp. rate 16, height 5\' 5"  (1.651 m), weight 70.3 kg, last menstrual period 04/21/2022, SpO2 100 %, unknown if currently breastfeeding.  Physical Exam:  General: alert, cooperative and no distress Chest: no respiratory distress Heart:regular rate, distal pulses intact Abdomen: soft, nontender,  Uterine Fundus: firm, appropriately tender DVT Evaluation: No calf swelling or tenderness Extremities: no edema Skin: warm, dry  Recent Labs    02/02/23 2043 02/04/23 0552  HGB 9.8* 9.6*  HCT 30.1* 28.9*    Assessment/Plan: Gloria Heath is a 37 y.o. W2N5621 s/p SVD at [redacted]w[redacted]d   PPD#1 - Doing well  Routine postpartum care  Patient is homeless, SW consult in   Elevated BP after delivery. Several elevated Bps overnight. Will start lasix for 5 days and procardia 30 mg daily.  Contraception: Nuvaring Feeding: breast Dispo: Plan for discharge tomorrow .   LOS: 2 days   Derrel Nip, MD  OB Fellow  02/04/2023, 8:11 AM

## 2023-02-05 ENCOUNTER — Other Ambulatory Visit (HOSPITAL_COMMUNITY): Payer: Self-pay

## 2023-02-05 DIAGNOSIS — M25362 Other instability, left knee: Secondary | ICD-10-CM | POA: Insufficient documentation

## 2023-02-05 DIAGNOSIS — Z5900 Homelessness unspecified: Secondary | ICD-10-CM

## 2023-02-05 MED ORDER — CYCLOBENZAPRINE HCL 10 MG PO TABS
5.0000 mg | ORAL_TABLET | Freq: Three times a day (TID) | ORAL | Status: DC | PRN
  Administered 2023-02-05: 5 mg via ORAL
  Filled 2023-02-05: qty 1

## 2023-02-05 MED ORDER — ACETAMINOPHEN 325 MG PO TABS
650.0000 mg | ORAL_TABLET | ORAL | 0 refills | Status: AC | PRN
  Filled 2023-02-05: qty 30, 3d supply, fill #0

## 2023-02-05 MED ORDER — SENNOSIDES-DOCUSATE SODIUM 8.6-50 MG PO TABS
2.0000 | ORAL_TABLET | ORAL | 0 refills | Status: AC
  Filled 2023-02-05: qty 30, 15d supply, fill #0

## 2023-02-05 MED ORDER — NIFEDIPINE ER 30 MG PO TB24
30.0000 mg | ORAL_TABLET | Freq: Every day | ORAL | 0 refills | Status: AC
  Filled 2023-02-05: qty 60, 60d supply, fill #0

## 2023-02-05 MED ORDER — IBUPROFEN 600 MG PO TABS
600.0000 mg | ORAL_TABLET | Freq: Four times a day (QID) | ORAL | 0 refills | Status: AC
  Filled 2023-02-05: qty 30, 8d supply, fill #0

## 2023-02-05 MED ORDER — FUROSEMIDE 20 MG PO TABS
20.0000 mg | ORAL_TABLET | Freq: Every day | ORAL | 0 refills | Status: AC
  Filled 2023-02-05: qty 5, 5d supply, fill #0

## 2023-02-05 NOTE — Social Work (Signed)
CPS SW met with MOB, current barriers to infants discharge. CFT scheduled for tomorrow to determine discharge plan for the infant.   Charlissa Petros, LCSWA Clinical Social Worker [phone] 

## 2023-02-05 NOTE — Social Work (Signed)
CSW was made aware that MOB has an open APS case. MOB's APS worker Cherylann Ratel met with MOB, MOB reported she plans to go to safe house at discharge.   MOB's CPS worker plans to meet with MOB today.  Wende Neighbors, LCSWA Clinical Social Worker [phone]

## 2023-02-05 NOTE — Discharge Instructions (Signed)
-   Continue your prenatal vitamins especially if breastfeeding - Try to eat iron rich food. - Take over the counter tylenol (500mg ) or ibuprofen (200mg ) three times a day as needed for cramping/pain. - continue blood pressure medication. - Take water pill (lasix) as prescribed for a total of 5 days. - follow up in clinic in 1 week for a blood pressure check and in 4-6 weeks as scheduled for your regular post partum visit. - Please come back to [facility] if you notice persistently elevated blood pressures or you start to have a headache, that doesn't get better with medications (tylenol and ibuprofen), rest (4hrs of sleep) and drinking water. - Try to follow up outpatient with sports medicine to address your knee pain further.

## 2023-02-05 NOTE — Progress Notes (Signed)
Follow up visit with Gloria Heath to provide continued support during her hospitalization. Gloria Heath has very minimal emotional support as she has been living on the [address] since her divorce a few years ago.  Gloria Heath was in her room at the time of visit. She was appropriately caring for her son, Gloria Heath, breastfeeding on demand and sharing about ways she is spoon feeding when needed to ensure that he is getting enough milk. Chaplain asked open ended questions to facilitate emotional expression and story telling. Gloria Heath shared that having Gloria Heath has given her more hope and purpose, but it has also further exposed the grief of losing access to her daughter Gloria Heath in her divorce. She reports that for years the pain has been so unbearable that it's been difficult to function the way she needed to get financially stable. She can see that has created a self fulfilling prophecy where the pain intensified her depression and inability to get on her feet financially, but unstable financial status meant that she was unable to get to her daughter. Gloria Heath's birth has enlivened those mothering instincts and while it has also made her long for Gloria Heath more, she feels renewed inspiration to figure out a plan to find a job and housing to get her life back. She feels somewhat hopeful about the possibility of going to a [facility] after discharge and then going to [place] where she has some supportive family. Gloria Heath continues to untangle family stress that she feels may be heightened by her sister's decision to place a child for adoption when she was younger as well as Hindy's decision to chose her own path with regard to religion.   She reports that she is healing from delivery, but has an increasingly bad headache. The pediatrician recommended supplementation for [name] and she is sad about that, but willing to do what is necessary.  Chaplain utilized reflective listening to assist with reframing complicated relationships, meaning making, and the  identification of strengths and coping strategies.   Please page as further needs arise.  Maryanna Shape. Carley Hammed, M.Div. [facility] Chaplain Pager [phone] Office [phone]

## 2023-02-06 ENCOUNTER — Ambulatory Visit (HOSPITAL_COMMUNITY): Payer: Self-pay

## 2023-02-06 NOTE — Lactation Note (Signed)
This note was copied from a baby's chart. Lactation Consultation Note  Patient Name: Gloria Heath Date: 02/06/2023 Age:39 days Reason for consult: Term;Mother's request;Infant weight loss (-10.45% weight loss,), See Birth Parent MR P2, term female infant, Birth Parent was using DEBP when LC entered the room, pumping only one breast, had DEBP dial turned all the way up, complained about nipple soreness and areola edema. LC re-fitted Birth Parent with size 27 mm, explained not turn dial all the way on pump and limit pumping to 15 minutes. Birth Parent latched infant on her left breast using the cross cradle hold, worked on infant having deeper latch, infant BF for 10 minutes, afterwards was given 5 mls of EBM and 20 mls of 22 kcal formula using a slow flow bottle nipple.   1- Birth Parent will continue to BF infant 15 minutes or less, 8 to 12+ times within 24 hours, STS and continue to supplement infant with her EBM first and then 22 kcal to help stabilize weight loss.  2- Birth Parent knows after latching infant at the breast , on Day 4 of life to offer 45 mls+ per feeding or more if infant wants it. 3- Birth Parent will continue to use DEBP every 3 hours for 15 minutes on initial setting.  Maternal Data    Feeding Mother's Current Feeding Choice: Breast Milk and Formula Nipple Type: Slow - flow  LATCH Score Latch: Grasps breast easily, tongue down, lips flanged, rhythmical sucking.  Audible Swallowing: Spontaneous and intermittent  Type of Nipple: Everted at rest and after stimulation  Comfort (Breast/Nipple): Soft / non-tender  Hold (Positioning): Assistance needed to correctly position infant at breast and maintain latch.  LATCH Score: 9   Lactation Tools Discussed/Used Breast pump type: Double-Electric Breast Pump Pump Education: Setup, frequency, and cleaning;Milk Storage Reason for Pumping: Infant with high weight loss Pumping frequency: Birth Parent will continue to  pump every 3 hours for 15 minutes on inital setting. Pumped volume: 10 mL  Interventions Interventions: LC Services brochure;Pace feeding;Education;DEBP;Position options;Skin to skin  Discharge    Consult Status Consult Status: Follow-up Date: 02/07/23 Follow-up type: In-patient    Frederico Hamman 02/06/2023, 10:26 PM

## 2023-02-08 ENCOUNTER — Ambulatory Visit (HOSPITAL_COMMUNITY): Payer: Self-pay

## 2023-02-08 NOTE — Lactation Note (Signed)
This note was copied from a baby's chart. Lactation Consultation Note  Patient Name: Boy Marvalene Coveney [id number] Date: 02/08/2023 Age:39 days Reason for consult: Infant weight loss;Follow-up assessment (change in weight loss from 10.45 to 9.40 %.) Per Birth Parent, she feel her breast is becoming fuller and larger but not engorged, now pumping 40 mls when using DEBP, infant is breastfeeding for 15 minutes and she hears swallows with feedings but infant will only consume 30 mls of EBM after latching at the breast but not 40 mls. Birth Parent would like to do pre weight before latching infant at the breast and post weight  after latching to see how much EBM infant is transferring.  LC informed RN and RN will dicussed this with Birth Parent when doing family assessment.   Current feeding plan: 1- Birth Parent will continue to latch infant by cues, on demand, 8 to 12+ times within 24 hours, limit chest feeding to 20 minutes or less and continue to supplement infant with EBM or formula to help stabilize weight loss.  2- Birth Parent will work on increasing supplement volume to 46mls+ per feeding after latching infant at the breast or more if infant wants it. 3- Birth Parent will continue to use DEBP every 3 hours for 15 minutes on initial setting.  Maternal Data    Feeding Mother's Current Feeding Choice: Breast Milk and Formula  LATCH Score  LC did not observe latch with recent feeding, Per Birth Parent, infant BF for 15 minutes at 1:30 am and was supplemented with 30 mls of EBM that was pumped.                   Lactation Tools Discussed/Used    Interventions Interventions: Pace feeding;Education  Discharge    Consult Status Consult Status: Follow-up Date: 02/08/23 Follow-up type: In-patient    Frederico Hamman 02/08/2023, 2:08 AM

## 2023-02-09 ENCOUNTER — Ambulatory Visit (HOSPITAL_COMMUNITY): Payer: Self-pay

## 2023-02-09 NOTE — Lactation Note (Addendum)
This note was copied from a baby's chart. Lactation Consultation Note  Patient Name: Gloria Heath [id number] Date: 02/09/2023 Age:39 days  Reason for consult: Infant weight loss;Term (Infant had weight change 02/08/23 from -10.45% to -7.91% today infant is regaining weight that was loss.)  Per Birth Parent, infant is breast and formula feeding well. LC did not observe latch with current feeding due Birth Parent solely formula feeding infant was at the end of the feeding infant consumed 60 mls of 22 kcal formula. Birth Parent plans to use the  DEBP when she finish formula feeding infant. Birth Parent alternates feedings from  breast and then formula at next feeding. She does use the DEBP  and is aware of engorgement prevention and treatment. Birth Parent will continue to feed infant by cues, on demand, 8 to 12+ times within 24 hours. Birth Parent doesn't have any questions or concerns for LC at this time.   Maternal Data    Feeding Mother's Current Feeding Choice: Breast Milk and Formula Nipple Type: Slow - flow  LATCH Score Latch: Grasps breast easily, tongue down, lips flanged, rhythmical sucking.  Audible Swallowing: Spontaneous and intermittent  Type of Nipple: Everted at rest and after stimulation  Comfort (Breast/Nipple): Soft / non-tender  Hold (Positioning): No assistance needed to correctly position infant at breast.  LATCH Score: 10   Lactation Tools Discussed/Used  LC did not observe latch with current feeding due to infant being bottle feed ( 22 kcal formula).   Interventions Interventions: Education;Pace feeding  Discharge    Consult Status Consult Status: Follow-up Date: 02/10/23 Follow-up type: In-patient    Frederico Hamman 02/09/2023, 5:14 PM

## 2023-02-10 ENCOUNTER — Ambulatory Visit (HOSPITAL_COMMUNITY): Payer: Self-pay

## 2023-02-10 NOTE — Lactation Note (Signed)
This note was copied from a baby's chart. Lactation Consultation Note  Patient Name: Gloria Heath Date: 02/10/2023 Age:39 days Reason for consult: Follow-up assessment;Term;Infant weight loss (baby being D/C today. LC reviewed doc flow sheets and mom provided updated data . LC reviewed BF D/C teaching.)   Maternal Data    Feeding Mother's Current Feeding Choice: Breast Milk and Formula  LATCH Score   Lactation Tools Discussed/Used Tools: Pump Breast pump type: Double-Electric Breast Pump;Manual Pump Education: Milk Storage  Interventions Interventions: Breast feeding basics reviewed;Education;Hand pump;DEBP  Discharge Discharge Education: Engorgement and breast care;Warning signs for feeding baby Pump: Manual;Personal (per mom will be getting a DEBP)  Consult Status Consult Status: Complete Date: 02/10/23    Kathrin Greathouse 02/10/2023, 12:56 PM

## 2023-02-13 ENCOUNTER — Telehealth (HOSPITAL_COMMUNITY): Payer: Self-pay | Admitting: *Deleted

## 2023-02-13 NOTE — Telephone Encounter (Signed)
Phone number not correct. Unable to contact patient.  Duffy Rhody, [place] 02-13-2023 at 2:49pm
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
## 2024-04-11 ENCOUNTER — Other Ambulatory Visit (HOSPITAL_COMMUNITY): Payer: Self-pay
